# Patient Record
Sex: Female | Born: 1985 | Race: White | Hispanic: No | Marital: Married | Smoking: Never smoker
Health system: Southern US, Community
[De-identification: ages and names within clinical notes are randomized; demographics above are authoritative.]

## PROBLEM LIST (undated history)

## (undated) DIAGNOSIS — F419 Anxiety disorder, unspecified: Secondary | ICD-10-CM

## (undated) DIAGNOSIS — J302 Other seasonal allergic rhinitis: Secondary | ICD-10-CM

## (undated) DIAGNOSIS — D259 Leiomyoma of uterus, unspecified: Secondary | ICD-10-CM

## (undated) DIAGNOSIS — R55 Syncope and collapse: Secondary | ICD-10-CM

## (undated) DIAGNOSIS — M25559 Pain in unspecified hip: Secondary | ICD-10-CM

## (undated) DIAGNOSIS — B070 Plantar wart: Secondary | ICD-10-CM

## (undated) DIAGNOSIS — F988 Other specified behavioral and emotional disorders with onset usually occurring in childhood and adolescence: Secondary | ICD-10-CM

## (undated) DIAGNOSIS — R5383 Other fatigue: Secondary | ICD-10-CM

## (undated) DIAGNOSIS — J45909 Unspecified asthma, uncomplicated: Secondary | ICD-10-CM

## (undated) DIAGNOSIS — F329 Major depressive disorder, single episode, unspecified: Secondary | ICD-10-CM

## (undated) DIAGNOSIS — F32A Depression, unspecified: Secondary | ICD-10-CM

## (undated) DIAGNOSIS — D649 Anemia, unspecified: Secondary | ICD-10-CM

## (undated) HISTORY — DX: Unspecified asthma, uncomplicated: J45.909

## (undated) HISTORY — DX: Anemia, unspecified: D64.9

## (undated) HISTORY — DX: Major depressive disorder, single episode, unspecified: F32.9

## (undated) HISTORY — PX: RECONSTRUCTION MANDIBLE / MAXILLA: SUR1083

## (undated) HISTORY — DX: Other seasonal allergic rhinitis: J30.2

## (undated) HISTORY — DX: Pain in unspecified hip: M25.559

## (undated) HISTORY — PX: NASAL SEPTUM SURGERY: SHX37

## (undated) HISTORY — DX: Anxiety disorder, unspecified: F41.9

## (undated) HISTORY — DX: Depression, unspecified: F32.A

## (undated) HISTORY — DX: Other specified behavioral and emotional disorders with onset usually occurring in childhood and adolescence: F98.8

---
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
## 2009-11-10 ENCOUNTER — Emergency Department (HOSPITAL_COMMUNITY): Admission: EM | Admit: 2009-11-10 | Discharge: 2009-11-10 | Payer: Self-pay | Admitting: Emergency Medicine

## 2010-10-09 ENCOUNTER — Emergency Department (HOSPITAL_COMMUNITY): Admission: EM | Admit: 2010-10-09 | Discharge: 2010-10-09 | Payer: Self-pay | Admitting: Family Medicine

## 2010-11-03 ENCOUNTER — Emergency Department (HOSPITAL_COMMUNITY)
Admission: EM | Admit: 2010-11-03 | Discharge: 2010-11-03 | Payer: Self-pay | Source: Home / Self Care | Admitting: Emergency Medicine

## 2011-03-06 LAB — STREP A DNA PROBE: Group A Strep Probe: NEGATIVE

## 2011-07-08 ENCOUNTER — Inpatient Hospital Stay (INDEPENDENT_AMBULATORY_CARE_PROVIDER_SITE_OTHER)
Admission: RE | Admit: 2011-07-08 | Discharge: 2011-07-08 | Disposition: A | Discharge status: Home / Self Care | Payer: 59 | Source: Ambulatory Visit | Attending: Family Medicine | Admitting: Family Medicine

## 2011-07-08 DIAGNOSIS — H60399 Other infective otitis externa, unspecified ear: Secondary | ICD-10-CM

## 2011-09-08 ENCOUNTER — Emergency Department (HOSPITAL_COMMUNITY)
Admission: EM | Admit: 2011-09-08 | Discharge: 2011-09-08 | Disposition: A | Discharge status: Home / Self Care | Payer: No Typology Code available for payment source | Attending: Emergency Medicine | Admitting: Emergency Medicine

## 2011-09-08 DIAGNOSIS — F988 Other specified behavioral and emotional disorders with onset usually occurring in childhood and adolescence: Secondary | ICD-10-CM | POA: Insufficient documentation

## 2011-09-08 DIAGNOSIS — IMO0002 Reserved for concepts with insufficient information to code with codable children: Secondary | ICD-10-CM | POA: Insufficient documentation

## 2011-09-08 DIAGNOSIS — I10 Essential (primary) hypertension: Secondary | ICD-10-CM | POA: Insufficient documentation

## 2011-09-10 LAB — POCT PREGNANCY, URINE: Preg Test, Ur: NEGATIVE

## 2012-04-23 ENCOUNTER — Telehealth: Payer: Self-pay

## 2012-04-23 NOTE — Telephone Encounter (Signed)
PT IN NEED OF HER CONCERTA. PLEASE CALL [phone]

## 2012-04-24 MED ORDER — METHYLPHENIDATE HCL ER (OSM) 18 MG PO TBCR: 36.0000 mg | EXTENDED_RELEASE_TABLET | ORAL | Status: DC

## 2012-04-24 NOTE — Telephone Encounter (Signed)
Rx printed.  Needs OV for next fill. 

## 2012-04-24 NOTE — Telephone Encounter (Signed)
Chart states pt taking Concerta 18mg  2 PO/day. Verified with pt and this dosage is correct. Ok to rf?

## 2012-04-25 NOTE — Telephone Encounter (Signed)
LMOM THAT RX READY FOR P/U AND THAT SHE WILL NEED OV FOR MORE MED.

## 2012-07-04 ENCOUNTER — Telehealth: Payer: Self-pay

## 2012-07-04 NOTE — Telephone Encounter (Signed)
Please call patient-needs OV.  What's the plan.

## 2012-07-04 NOTE — Telephone Encounter (Signed)
I called her to advise and she will come in on Monday

## 2012-07-04 NOTE — Telephone Encounter (Signed)
Looks like OV is needed... Correct?

## 2012-07-04 NOTE — Telephone Encounter (Signed)
Pt is requesting refill on methylphenidate (CONCERTA) 18 MG CR tablet

## 2012-09-01 ENCOUNTER — Ambulatory Visit (INDEPENDENT_AMBULATORY_CARE_PROVIDER_SITE_OTHER): Payer: 59 | Admitting: Physician Assistant

## 2012-09-01 VITALS — BP 106/74 | HR 74 | Temp 98.0°F | Resp 16 | Ht 63.0 in | Wt 135.8 lb

## 2012-09-01 DIAGNOSIS — F329 Major depressive disorder, single episode, unspecified: Secondary | ICD-10-CM

## 2012-09-01 DIAGNOSIS — Z3009 Encounter for other general counseling and advice on contraception: Secondary | ICD-10-CM

## 2012-09-01 DIAGNOSIS — D509 Iron deficiency anemia, unspecified: Secondary | ICD-10-CM | POA: Insufficient documentation

## 2012-09-01 DIAGNOSIS — Z30011 Encounter for initial prescription of contraceptive pills: Secondary | ICD-10-CM

## 2012-09-01 DIAGNOSIS — R87619 Unspecified abnormal cytological findings in specimens from cervix uteri: Secondary | ICD-10-CM

## 2012-09-01 DIAGNOSIS — F419 Anxiety disorder, unspecified: Secondary | ICD-10-CM | POA: Insufficient documentation

## 2012-09-01 DIAGNOSIS — F32A Depression, unspecified: Secondary | ICD-10-CM

## 2012-09-01 DIAGNOSIS — D649 Anemia, unspecified: Secondary | ICD-10-CM

## 2012-09-01 DIAGNOSIS — Z23 Encounter for immunization: Secondary | ICD-10-CM

## 2012-09-01 DIAGNOSIS — Z124 Encounter for screening for malignant neoplasm of cervix: Secondary | ICD-10-CM

## 2012-09-01 DIAGNOSIS — IMO0002 Reserved for concepts with insufficient information to code with codable children: Secondary | ICD-10-CM

## 2012-09-01 DIAGNOSIS — J45990 Exercise induced bronchospasm: Secondary | ICD-10-CM

## 2012-09-01 DIAGNOSIS — Z113 Encounter for screening for infections with a predominantly sexual mode of transmission: Secondary | ICD-10-CM

## 2012-09-01 DIAGNOSIS — F988 Other specified behavioral and emotional disorders with onset usually occurring in childhood and adolescence: Secondary | ICD-10-CM

## 2012-09-01 LAB — POCT CBC
Granulocyte percent: 50 %G (ref 37–80)
HCT, POC: 35.4 % — AB (ref 37.7–47.9)
Hemoglobin: 10.6 g/dL — AB (ref 12.2–16.2)
Lymph, poc: 2.1 (ref 0.6–3.4)
MPV: 11.2 fL (ref 0–99.8)
POC Granulocyte: 2.6 (ref 2–6.9)
POC MID %: 8.8 %M (ref 0–12)
RBC: 4.25 M/uL (ref 4.04–5.48)

## 2012-09-01 LAB — POCT UA - MICROSCOPIC ONLY
Mucus, UA: NEGATIVE
RBC, urine, microscopic: NEGATIVE

## 2012-09-01 LAB — POCT URINALYSIS DIPSTICK
Bilirubin, UA: NEGATIVE
Blood, UA: NEGATIVE
Glucose, UA: NEGATIVE
Ketones, UA: NEGATIVE
Nitrite, UA: NEGATIVE
pH, UA: 6

## 2012-09-01 LAB — HIV ANTIBODY (ROUTINE TESTING W REFLEX): HIV: NONREACTIVE

## 2012-09-01 MED ORDER — NORGESTIMATE-ETH ESTRADIOL 0.25-35 MG-MCG PO TABS: 1.0000 | ORAL_TABLET | Freq: Every day | ORAL | Status: DC

## 2012-09-01 MED ORDER — METHYLPHENIDATE HCL ER (OSM) 36 MG PO TBCR: 36.0000 mg | EXTENDED_RELEASE_TABLET | ORAL | Status: DC

## 2012-09-01 MED ORDER — ALBUTEROL SULFATE HFA 108 (90 BASE) MCG/ACT IN AERS: 2.0000 | INHALATION_SPRAY | RESPIRATORY_TRACT | Status: DC | PRN

## 2012-09-01 MED ORDER — VENLAFAXINE HCL ER 75 MG PO CP24: 75.0000 mg | ORAL_CAPSULE | Freq: Every day | ORAL | Status: DC
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
## 2012-09-02 ENCOUNTER — Telehealth: Payer: Self-pay | Admitting: Physician Assistant

## 2012-09-02 NOTE — Telephone Encounter (Signed)
Patient states that Christy Alvarado didn't receive the Rx for Effexor XR I sent yesterday.  1. Please call patient to clarify her dose (dose she take 75 mg 1 QD or 2 QD?  If 2 QD, would she rather take one 150 mg?)  2. Call the Alvarado and verify/clarify the Rx for #90 days, RF x 3.

## 2012-09-03 ENCOUNTER — Other Ambulatory Visit: Payer: Self-pay | Admitting: Radiology

## 2012-09-03 DIAGNOSIS — F329 Major depressive disorder, single episode, unspecified: Secondary | ICD-10-CM

## 2012-09-03 MED ORDER — VENLAFAXINE HCL ER 75 MG PO CP24: ORAL_CAPSULE | ORAL | Status: DC

## 2012-09-03 NOTE — Telephone Encounter (Signed)
Called pt, Effexor dose is is 75 mg bid. She wants to stay at this dose.so I called in Effexor XR 75mg  2qd #180 with 3 RF.

## 2012-09-05 LAB — PAP IG, CT-NG, RFX HPV ASCU

## 2012-09-06 NOTE — Addendum Note (Signed)
Addended by: Johnnette Litter on: 09/06/2012 11:11 AM   Modules accepted: Orders

## 2012-09-24 ENCOUNTER — Ambulatory Visit (INDEPENDENT_AMBULATORY_CARE_PROVIDER_SITE_OTHER): Payer: 59 | Admitting: Family Medicine

## 2012-09-24 VITALS — BP 122/88 | HR 84 | Temp 98.0°F | Resp 16 | Ht 63.0 in | Wt 131.0 lb

## 2012-09-24 DIAGNOSIS — R5381 Other malaise: Secondary | ICD-10-CM

## 2012-09-24 LAB — POCT CBC
Granulocyte percent: 63.3 %G (ref 37–80)
MCH, POC: 25.7 pg — AB (ref 27–31.2)
MCV: 83.6 fL (ref 80–97)
MID (cbc): 0.5 (ref 0–0.9)
MPV: 10.9 fL (ref 0–99.8)
POC LYMPH PERCENT: 30.3 %L (ref 10–50)
POC MID %: 6.4 %M (ref 0–12)
Platelet Count, POC: 252 10*3/uL (ref 142–424)
RBC: 4.9 M/uL (ref 4.04–5.48)
RDW, POC: 14.8 %
WBC: 7.7 10*3/uL (ref 4.6–10.2)

## 2012-09-24 NOTE — Progress Notes (Signed)
[facility] [address], [place] [address] [phone]  Date:  09/24/2012   Name:  Christy Alvarado   DOB:  06/29/1986   MRN:  [id number]  PCP:  Tally Due, MD    Chief Complaint: Dizziness, Headache and Generalized Body Aches   History of Present Illness:  Christy Alvarado is a 26 y.o. very pleasant female patient who presents with the following:  She was at work a couple of hours ago and noted the onset of HA and dizziness.  She still feels lightheaded.  Her HA is better- just a slight now.  She does have body aches as well- mostly in her back and shoulders.   She has not taken any anti- pyretics.  No cough, no ST, no fever noted as of yet.   She feels tired.  She does have a runny nose.   No GI symptoms such as N/V/D.  She did have some abdominal discomfort earlier today that got better after she had a BM.  She did eat breakfast this morning.   She felt fine yesterday LMP 09/15/12.   She works in [employer] so sick contacts are always possible  Patient Active Problem List  Diagnosis  . Depression  . ADD (attention deficit disorder)  . Anemia  . Exercise-induced asthma    Past Medical History  Diagnosis Date  . Depression   . ADD (attention deficit disorder)   . Seasonal allergies   . Anemia   . Asthma   . Anxiety     Past Surgical History  Procedure Date  . Reconstruction mandible / maxilla     underbite correction    History  Substance Use Topics  . Smoking status: Never Smoker   . Smokeless tobacco: Never Used  . Alcohol Use: 0.0 - 6.0 oz/week    0-10 Cans of beer per week    Family History  Problem Relation Age of Onset  . Cancer Father     prostate    No Known Allergies  Medication list has been reviewed and updated.  Current Outpatient Prescriptions on File Prior to Visit  Medication Sig Dispense Refill  . albuterol (PROVENTIL HFA;VENTOLIN HFA) 108 (90 BASE) MCG/ACT inhaler Inhale 2 puffs into the lungs  every 4 (four) hours as needed for wheezing (cough, shortness of breath or wheezing.).  1 Inhaler  1  . ferrous sulfate 325 (65 FE) MG EC tablet Take 325 mg by mouth daily. Doesn't take them every day      . methylphenidate (CONCERTA) 36 MG CR tablet Take 1 tablet (36 mg total) by mouth every morning.  30 tablet  0  . norgestimate-ethinyl estradiol (ORTHO-CYCLEN,SPRINTEC,PREVIFEM) 0.25-35 MG-MCG tablet Take 1 tablet by mouth daily.  30 Package  4  . venlafaxine XR (EFFEXOR-XR) 75 MG 24 hr capsule 2 capsules daily  180 capsule  3  . methylphenidate (CONCERTA) 36 MG CR tablet Take 1 tablet (36 mg total) by mouth every morning. May fill on/after 09/30/2012.  30 tablet  0  . methylphenidate (CONCERTA) 36 MG CR tablet Take 1 tablet (36 mg total) by mouth every morning. May fill on/after 10/30/2012.  30 tablet  0    Review of Systems:  As per HPI- otherwise negative.   Physical Examination: Filed Vitals:   09/24/12 1402  BP: 122/88  Pulse: 84  Temp: 98 F (36.7 C)  Resp: 16   Filed Vitals:   09/24/12 1402  Height: 5\' 3"  (1.6  m)  Weight: 131 lb (59.421 kg)   Body mass index is 23.21 kg/(m^2). Ideal Body Weight: Weight in (lb) to have BMI = 25: 140.8   GEN: WDWN, NAD, Non-toxic, A & O x 3, looks well HEENT: Atraumatic, Normocephalic. Neck supple. No masses, No LAD.  Bilateral TM wnl, oropharynx normal.  PEERL,EOMI.   No meningismus Ears and Nose: No external deformity. CV: RRR, No M/G/R. No JVD. No thrill. No extra heart sounds. PULM: CTA B, no wheezes, crackles, rhonchi. No retractions. No resp. distress. No accessory muscle use. ABD: S, NT, ND, +BS. No rebound. No HSM. EXTR: No c/c/e NEURO Normal gait. Normal strength, sensation and DTR all extremities.  PSYCH: Normally interactive. Conversant. Not depressed or anxious appearing.  Calm demeanor.   Results for orders placed in visit on 09/24/12  POCT CBC      Component Value Range   WBC 7.7  4.6 - 10.2 K/uL   Lymph, poc 2.3   0.6 - 3.4   POC LYMPH PERCENT 30.3  10 - 50 %L   MID (cbc) 0.5  0 - 0.9   POC MID % 6.4  0 - 12 %M   POC Granulocyte 4.9  2 - 6.9   Granulocyte percent 63.3  37 - 80 %G   RBC 4.90  4.04 - 5.48 M/uL   Hemoglobin 12.6  12.2 - 16.2 g/dL   HCT, POC 16.1  09.6 - 47.9 %   MCV 83.6  80 - 97 fL   MCH, POC 25.7 (*) 27 - 31.2 pg   MCHC 30.7 (*) 31.8 - 35.4 g/dL   RDW, POC 04.5     Platelet Count, POC 252  142 - 424 K/uL   MPV 10.9  0 - 99.8 fL     Assessment and Plan: 1. Malaise  POCT CBC   Christy Alvarado is ill with non- specific symptoms today.  She may have a viral illness or even the flu.  Stressed to her the importance of close follow- up if she is not better or getting worse.  Gave her a note for work.  She will rest and push fluids.    Christy Amsterdam, MD

## 2012-09-24 NOTE — Patient Instructions (Addendum)
You probably have a viral infection.  Drink plenty of fluids and rest.  If you are not better within the next 36 hours please give [place] a call or come back in.

## 2012-11-21 ENCOUNTER — Ambulatory Visit: Payer: 59

## 2012-11-21 ENCOUNTER — Ambulatory Visit (INDEPENDENT_AMBULATORY_CARE_PROVIDER_SITE_OTHER): Payer: 59 | Admitting: Physician Assistant

## 2012-11-21 VITALS — BP 111/67 | HR 62 | Temp 97.9°F | Resp 16 | Ht 63.0 in | Wt 135.0 lb

## 2012-11-21 DIAGNOSIS — L600 Ingrowing nail: Secondary | ICD-10-CM

## 2012-11-21 DIAGNOSIS — M79609 Pain in unspecified limb: Secondary | ICD-10-CM

## 2012-11-21 DIAGNOSIS — M79676 Pain in unspecified toe(s): Secondary | ICD-10-CM

## 2012-11-21 MED ORDER — CEPHALEXIN 500 MG PO CAPS: 500.0000 mg | ORAL_CAPSULE | Freq: Three times a day (TID) | ORAL | Status: DC

## 2012-11-21 MED ORDER — HYDROCODONE-ACETAMINOPHEN 5-325 MG PO TABS: 1.0000 | ORAL_TABLET | Freq: Four times a day (QID) | ORAL | Status: DC | PRN

## 2012-11-21 NOTE — Progress Notes (Addendum)
Patient ID: Christy Alvarado MRN: [id number], DOB: 1986/01/27, 26 y.o. Date of Encounter: 11/21/2012, 5:10 PM  Primary Physician: Tally Due, MD  Chief Complaint: Ingrown right great toenail  HPI: 26 y.o. year old female with history below presents with a one week history of right great toe pain. She states one week ago she stubbed it, but is not certain on what. Since she stubbed it there has been continual pain along the lateral aspect of the nail. She now has some erythema and tissue growing over the lateral aspect of the nail. It is sore when she walks. She does have normal movement and sensation.    Past Medical History  Diagnosis Date  . Depression   . ADD (attention deficit disorder)   . Seasonal allergies   . Anemia   . Asthma   . Anxiety      Home Meds: Prior to Admission medications   Medication Sig Start Date End Date Taking? Authorizing Provider  albuterol (PROVENTIL HFA;VENTOLIN HFA) 108 (90 BASE) MCG/ACT inhaler Inhale 2 puffs into the lungs every 4 (four) hours as needed for wheezing (cough, shortness of breath or wheezing.). 09/01/12  Yes [name] S Jeffery, PA-C  ferrous sulfate 325 (65 FE) MG EC tablet Take 325 mg by mouth daily. Doesn't take them every day   Yes Historical Provider, MD  methylphenidate (CONCERTA) 36 MG CR tablet Take 1 tablet (36 mg total) by mouth every morning. 09/01/12  Yes [name] Tessa Lerner, PA-C  venlafaxine XR (EFFEXOR-XR) 75 MG 24 hr capsule 2 capsules daily 09/03/12  Yes [name] S Jeffery, PA-C  methylphenidate (CONCERTA) 36 MG CR tablet Take 1 tablet (36 mg total) by mouth every morning. May fill on/after 09/30/2012. 09/01/12   Fernande Bras, PA-C  methylphenidate (CONCERTA) 36 MG CR tablet Take 1 tablet (36 mg total) by mouth every morning. May fill on/after 10/30/2012. 09/01/12   Fernande Bras, PA-C    Allergies: No Known Allergies  History   Social History  . Marital Status: Single    Spouse Name: n/a    Number of Children:  0  . Years of Education: 15+   Occupational History  . SERVER     [employer]   Social History Main Topics  . Smoking status: Never Smoker   . Smokeless tobacco: Never Used  . Alcohol Use: 0.0 - 6.0 oz/week    0-10 Cans of beer per week  . Drug Use: No  . Sexually Active: Yes -- Female partner(s)    Birth Control/ Protection: Condom   Other Topics Concern  . Not on file   Social History Narrative   Lives with 2 sisters (and soon-to-be brother-in-law) in the house next-door to their parents.     Review of Systems: Constitutional: negative for chills, fever, night sweats, weight changes, or fatigue  Cardiovascular: negative for chest pain or palpitations Respiratory: negative for hemoptysis, wheezing, shortness of breath, or cough Dermatological: see above   Physical Exam: Blood pressure 111/67, pulse 62, temperature 97.9 F (36.6 C), resp. rate 16, height 5\' 3"  (1.6 m), weight 135 lb (61.236 kg), last menstrual period 11/15/2012., Body mass index is 23.91 kg/(m^2). General: Well developed, well nourished, in no acute distress. Head: Normocephalic, atraumatic, eyes without discharge, sclera non-icteric, nares are without discharge.   Neck: Supple. No thyromegaly. Full ROM. No lymphadenopathy. Lungs: Breathing is unlabored. Heart: Regular rate. Msk:  Strength and tone normal for age. Extremities/Skin: Right great toe: Erythematous along lateral nail  edge with hyperplastic tissue consistent with ingrown toenail. Flexion/extension intact. Normal sensation. Cap refill less than 2 seconds. Warm and dry. No clubbing or cyanosis. No edema. No rashes or suspicious lesions. Neuro: Alert and oriented X 3. Moves all extremities spontaneously. Gait is normal. CNII-XII grossly in tact. Psych:  Responds to questions appropriately with a normal affect.   [facility] reading (PRIMARY) by  Dr. Perrin Maltese. Negative   ASSESSMENT AND PLAN:  26 y.o. year old female with right ingrown tow nail  secondary to trauma. -Wedge resection per above -Keflex 500 mg 1 po tid #30 no RF -Norco 5/325 mg 1 po q 4-6 hours prn pain #30 no RF, SED -Wound care -RTC precautions   Signed, Eula Listen, PA-C 11/21/2012 5:10 PM   Addendum:   PROCEDURE NOTE: Verbal consent obtained.  Risks and benefits explained.  Patient made an informed decision under a sound mind and judgement.  Sterile technique employed. Numbing: Anesthesia obtained with 1:1 ratio 2% plain lidocaine with 0.5% Marcaine, 4 cc total. Betadine prep per usual protocol.  Lateral aspect of nail lifted without difficulty and removed in total. Proximal aspect of nail bed explored revealing no nail remnants. Ingrown tissue debrided and irrigated. No active bleeding.  Xeroform dressing applied. Wound care instructions including precautions covered with patient. Handout given.   SignedEula Listen, PA-C 11/21/2012 7:43 PM

## 2012-11-21 NOTE — Patient Instructions (Addendum)
Ingrown Toenail  Please see your wound care insructions An ingrown toenail occurs when the sharp edge of your toenail grows into the skin. Causes of ingrown toenails include toenails clipped too far back or poorly fitting shoes. Activities involving sudden stops (basketball, tennis) causing "toe jamming" may lead to an ingrown nail. HOME CARE INSTRUCTIONS   Soak the whole foot in warm soapy water for 20 minutes, 3 times per day.  You may lift the edge of the nail away from the sore skin by wedging a small piece of cotton under the corner of the nail. Be careful not to dig (traumatize) and cause more injury to the area.  Wear shoes that fit well. While the ingrown nail is causing problems, sandals may be beneficial.  Trim your toenails regularly and carefully. Cut your toenails straight across, not in a curve. This will prevent injury to the skin at the corners of the toenail.  Keep your feet clean and dry.  Crutches may be helpful early in treatment if walking is painful.  Antibiotics, if prescribed, should be taken as directed.  Return for a wound check in 2 days or as directed.  Only take over-the-counter or prescription medicines for pain, discomfort, or fever as directed by your caregiver. SEEK IMMEDIATE MEDICAL CARE IF:   You have a fever.  You have increasing pain, redness, swelling, or heat at the wound site.  Your toe is not better in 7 days. If conservative treatment is not successful, surgical removal of a portion or all of the nail may be necessary. MAKE SURE YOU:   Understand these instructions.  Will watch your condition.  Will get help right away if you are not doing well or get worse. Document Released: 11/16/2000 Document Revised: 02/11/2012 Document Reviewed: 11/10/2008 [facility] Patient Information 2013 [place], [place].

## 2013-05-28 ENCOUNTER — Ambulatory Visit (INDEPENDENT_AMBULATORY_CARE_PROVIDER_SITE_OTHER): Payer: 59 | Admitting: Physician Assistant

## 2013-05-28 ENCOUNTER — Encounter: Payer: Self-pay | Admitting: Physician Assistant

## 2013-05-28 VITALS — BP 123/73 | HR 69 | Temp 98.3°F | Resp 16 | Ht 63.0 in | Wt 139.6 lb

## 2013-05-28 DIAGNOSIS — F329 Major depressive disorder, single episode, unspecified: Secondary | ICD-10-CM

## 2013-05-28 DIAGNOSIS — F3289 Other specified depressive episodes: Secondary | ICD-10-CM

## 2013-05-28 MED ORDER — VENLAFAXINE HCL ER 37.5 MG PO CP24: 37.5000 mg | ORAL_CAPSULE | Freq: Every day | ORAL | Status: DC

## 2013-05-28 MED ORDER — CLONAZEPAM 0.5 MG PO TABS: 0.2500 mg | ORAL_TABLET | Freq: Two times a day (BID) | ORAL | Status: DC | PRN

## 2013-05-28 NOTE — Patient Instructions (Signed)
If you do not tolerate the Effexor XR at the lower dose, even with the clonazepam, let me know and we'll switch you back to Paxil (or one of it's cousins).

## 2013-05-28 NOTE — Progress Notes (Signed)
  Subjective:    Patient ID: Christy Alvarado, female    DOB: 1986/03/06, 27 y.o.   MRN: [id number]  HPI This 27 y.o. female presents for evaluation of depression.  Most recently she was on Effexor XR 75 mg, but was having adverse effects.  She reports irritability and anxiety, sleep disturbance and nausea.  She also notes that she had been on this medication for a while, without adverse effects, but had been stopping and starting it when the symptoms began.  She read online that the symptoms could last for 3 weeks upon initiating the medication and she didn't think she could tolerate that.    She's generally done well off the effexor, but has recently had more depression and increased irritability.  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  Previously took Paxil and Pristiq, both with good effect, and both tolerated well, to her recollection.  Past medical history, surgical history, family history, social history and problem list reviewed.   Review of Systems As above.  No SI/HI.    Objective:   Physical Exam  Blood pressure 123/73, pulse 69, temperature 98.3 F (36.8 C), temperature source Oral, resp. rate 16, height 5\' 3"  (1.6 m), weight 139 lb 9.6 oz (63.322 kg), last menstrual period 05/21/2013, SpO2 99.00%. Body mass index is 24.74 kg/(m^2). Well-developed, well nourished WF who is awake, alert and oriented, in NAD. HEENT: [place]/AT, sclera and conjunctiva are clear.   Neck: supple, non-tender, no lymphadenopathy, thyromegaly. Heart: RRR, no murmur Lungs: normal effort, CTA Extremities: no cyanosis, clubbing or edema. Skin: warm and dry without rash. Psychologic: good mood and appropriate affect, normal speech and behavior.       Assessment & Plan:  Depression - Plan: venlafaxine XR (EFFEXOR-XR) 37.5 MG 24 hr capsule, clonazePAM (KLONOPIN) 0.5 MG tablet  Restart the Effexor at the lower  dose, to help reduce potential adverse effects, and PRN clonazepam. If they are intolerable at this dose, re-try paroxetine, as she's done well on it previously.    Fernande Bras, PA-C Physician Assistant-Certified [facility]

## 2013-06-09 ENCOUNTER — Ambulatory Visit (INDEPENDENT_AMBULATORY_CARE_PROVIDER_SITE_OTHER): Payer: 59 | Admitting: Physician Assistant

## 2013-06-09 VITALS — BP 122/72 | HR 86 | Temp 98.0°F | Resp 17 | Ht 63.5 in | Wt 137.0 lb

## 2013-06-09 DIAGNOSIS — J020 Streptococcal pharyngitis: Secondary | ICD-10-CM

## 2013-06-09 DIAGNOSIS — J029 Acute pharyngitis, unspecified: Secondary | ICD-10-CM

## 2013-06-09 MED ORDER — AMOXICILLIN 875 MG PO TABS: 875.0000 mg | ORAL_TABLET | Freq: Two times a day (BID) | ORAL | Status: DC

## 2013-06-09 MED ORDER — FIRST-DUKES MOUTHWASH MT SUSP: 5.0000 mL | Freq: Four times a day (QID) | OROMUCOSAL | Status: DC | PRN

## 2013-06-09 NOTE — Progress Notes (Signed)
Patient ID: GERRICA CYGAN MRN: [id number], DOB: 1986/02/05, 26 y.o. Date of Encounter: 06/09/2013, 6:59 PM  Primary Physician: Tally Due, MD  Chief Complaint:  Chief Complaint  Patient presents with  . Sore Throat    patient states her tonsils are swelling     HPI: 27 y.o. female presents with a 3-4 day history of sore throat. Subjective fever and chills. No cough, congestion, rhinorrhea, sinus pressure, otalgia, or headache. Normal hearing. No GI complaints. Able to swallow saliva, but hurts to do so. Decreased appetite secondary to sore throat.   Feels like the lower dose of Effexor is working well.   Past Medical History  Diagnosis Date  . Depression   . ADD (attention deficit disorder)   . Seasonal allergies   . Anemia   . Asthma   . Anxiety      Home Meds: Prior to Admission medications   Medication Sig Start Date End Date Taking? Authorizing Provider  albuterol (PROVENTIL HFA;VENTOLIN HFA) 108 (90 BASE) MCG/ACT inhaler Inhale 2 puffs into the lungs every 6 (six) hours as needed for wheezing or shortness of breath.   Yes Historical Provider, MD  clonazePAM (KLONOPIN) 0.5 MG tablet Take 0.5-1 tablets (0.25-0.5 mg total) by mouth 2 (two) times daily as needed for anxiety. 05/28/13  Yes [name] S Jeffery, PA-C  venlafaxine XR (EFFEXOR-XR) 37.5 MG 24 hr capsule Take 1 capsule (37.5 mg total) by mouth daily. 05/28/13  Yes [name] Tessa Lerner, PA-C                  Allergies: No Known Allergies  History   Social History  . Marital Status: Single    Spouse Name: n/a    Number of Children: 0  . Years of Education: 15+   Occupational History  . SERVER     [employer]   Social History Main Topics  . Smoking status: Never Smoker   . Smokeless tobacco: Never Used  . Alcohol Use: 0 - 6 oz/week    0-10 Cans of beer per week  . Drug Use: No  . Sexually Active: Yes -- Female partner(s)    Birth Control/ Protection: Condom   Other Topics Concern  .  Not on file   Social History Narrative   Lives with 2 sisters (and soon-to-be brother-in-law) in the house next-door to their parents.     Review of Systems: Constitutional: negative for chills, fever, night sweats or weight changes HEENT: see above Cardiovascular: negative for chest pain or palpitations Respiratory: negative for hemoptysis, wheezing, or shortness of breath Abdominal: negative for abdominal pain, nausea, vomiting or diarrhea Dermatological: negative for rash Neurologic: positive for headache   Physical Exam: Blood pressure 122/72, pulse 86, temperature 98 F (36.7 C), temperature source Oral, resp. rate 17, height 5' 3.5" (1.613 m), weight 137 lb (62.143 kg), last menstrual period 05/21/2013, SpO2 99.00%., Body mass index is 23.88 kg/(m^2). General: Well developed, well nourished, in no acute distress. Head: Normocephalic, atraumatic, eyes without discharge, sclera non-icteric, nares are patent. Bilateral auditory canals clear, TM's are without perforation, pearly grey with reflective cone of light bilaterally. No sinus TTP. Oral cavity moist, dentition normal. Posterior pharynx with post nasal drip, mild erythema, and mild exudate along the right tonsillar pillar. No peritonsillar abscess. Uvula midline.  Neck: Supple. No thyromegaly. Full ROM. Lymph nodes: less than 2 cm AC right side. Lungs: Clear bilaterally to auscultation without wheezes, rales, or rhonchi. Breathing is unlabored. Heart: RRR  with S1 S2. No murmurs, rubs, or gallops appreciated. Msk:  Strength and tone normal for age. Extremities: No clubbing or cyanosis. No edema. Neuro: Alert and oriented X 3. Moves all extremities spontaneously. CNII-XII grossly in tact. Psych:  Responds to questions appropriately with a normal affect.   Labs: Results for orders placed in visit on 06/09/13  POCT RAPID STREP A (OFFICE)      Result Value Range   Rapid Strep A Screen Negative  Negative   TC  pending  ASSESSMENT AND PLAN:  27 y.o. female with likely strep pharyngitis -Amoxicillin 875 mg 1 po bid #20 no RF -DMM 1 tsp po qid prn ST #120 mL RF 1 -Await culture results -New tooth brush -Tylenol/Motrin prn -Rest/fluids -RTC precautions -RTC 3-5 days if no improvement  Signed, Eula Listen, PA-C 06/09/2013 6:59 PM

## 2013-06-12 LAB — CULTURE, GROUP A STREP

## 2013-06-20 ENCOUNTER — Telehealth: Payer: Self-pay

## 2013-06-20 NOTE — Telephone Encounter (Signed)
Patient was bit by a dog and is currently at [facility] and wants a prescription called in to a pharmacy down there. [facility] [phone]

## 2013-06-21 NOTE — Telephone Encounter (Signed)
lmom that pt must be evaluated where she is and that we cannot call anything in because we have not evaluated her.

## 2014-04-14 ENCOUNTER — Ambulatory Visit (INDEPENDENT_AMBULATORY_CARE_PROVIDER_SITE_OTHER): Payer: 59 | Admitting: Physician Assistant

## 2014-04-14 VITALS — BP 100/60 | HR 61 | Temp 97.8°F | Resp 16 | Ht 62.0 in | Wt 133.0 lb

## 2014-04-14 DIAGNOSIS — M79609 Pain in unspecified limb: Secondary | ICD-10-CM

## 2014-04-14 DIAGNOSIS — B070 Plantar wart: Secondary | ICD-10-CM

## 2014-04-14 HISTORY — DX: Plantar wart: B07.0

## 2014-04-14 NOTE — Patient Instructions (Signed)
If you are seeing improvement, but not complete resolution - come back in 2 weeks, and we'll do another cryo treatment  If you are not improving (or certainly if you are worsening) please let me know sooner than 2 weeks   Warts Warts are a common viral infection. They are most commonly caused by the human papillomavirus (HPV). Warts can occur at all ages. However, they occur most frequently in older children and infrequently in the elderly. Warts may be single or multiple. Location and size varies. Warts can be spread by scratching the wart and then scratching normal skin. The life cycle of warts varies. However, most will disappear over many months to a couple years. Warts commonly do not cause problems (asymptomatic) unless they are over an area of pressure, such as the bottom of the foot. If they are large enough, they may cause pain with walking. DIAGNOSIS  Warts are most commonly diagnosed by their appearance. Tissue samples (biopsies) are not required unless the wart looks abnormal. Most warts have a rough surface, are round, oval, or irregular, and are skin-colored to light yellow, brown, or gray. They are generally less than  inch (1.3 cm), but they can be any size. TREATMENT   Observation or no treatment.  Freezing with liquid nitrogen.  High heat (cautery).  Boosting the body's immunity to fight off the wart (immunotherapy using Candida antigen).  Laser surgery.  Application of various irritants and solutions. HOME CARE INSTRUCTIONS  Follow your caregiver's instructions. No special precautions are necessary. Often, treatment may be followed by a return (recurrence) of warts. Warts are generally difficult to treat and get rid of. If treatment is done in a clinic setting, usually more than 1 treatment is required. This is usually done on only a monthly basis until the wart is completely gone. SEEK IMMEDIATE MEDICAL CARE IF: The treated skin becomes red, puffy (swollen), or  painful. Document Released: 08/29/2005 Document Revised: 03/16/2013 Document Reviewed: 02/24/2010 [facility] Patient Information 2014 [place].

## 2014-04-14 NOTE — Progress Notes (Signed)
   Subjective:    Patient ID: Christy Alvarado, female    DOB: Mar 14, 1986, 28 y.o.   MRN: [id number]  HPI   Christy Alvarado is a very pleasant 28 yr old female here with concern for a bump on the ball of her LEFT foot.  Has been there a couple months.  Now starting to become irritated.  Thinks possible a wart, but doesn't look like a wart.  No prior warts.  Does not think there could be a foreign body - does not recall stepping on anything.  She is a runner and frequently has calluses but never in this location.  She has no other lesions.  She otherwise feels well.    Review of Systems  Constitutional: Negative for fever and chills.  Respiratory: Negative.   Musculoskeletal: Negative.   Skin: Positive for wound (foot).       Objective:   Physical Exam  Vitals reviewed. Constitutional: She is oriented to person, place, and time. She appears well-developed and well-nourished. No distress.  HENT:  Head: Normocephalic and atraumatic.  Eyes: Conjunctivae are normal. No scleral icterus.  Cardiovascular: Intact distal pulses.   Pulmonary/Chest: Effort normal.  Neurological: She is alert and oriented to person, place, and time.  Skin: Skin is warm and dry.  Approx 1 cm round calloused area at ball of LEFT foot; does not appear classically like a wart, but given location and symptoms suspect in may in fact be a plantar wart  Psychiatric: She has a normal mood and affect. Her behavior is normal.    Procedure Note: Verbal consent obtained from the patient.  Topical lidocaine jelly applied to the area.  3 freeze/thaw cycles with liquid nitrogen applied.  Pt tolerated very well     Assessment & Plan:  Plantar wart   Christy Alvarado is a very pleasant 28 yr old female here with a calloused lesion on the ball of her LEFT foot, present for several months.  Likely plantar wart, so have treated as such with liquid nitrogen.  If not completely resolved in 2 wks, will repeat cryo - fast track card given for  this.  If worsening or no improvement, would refer to podiatry   E. Natividad Brood MHS, PA-C [facility] 5/14/20152:50 PM

## 2014-05-11 IMAGING — CR DG FOOT COMPLETE 3+V*R*
2 series · 2 of 2 positions shown · non-contrast
Comparison: None.

CLINICAL DATA: Stubbed toe.  Pain.

RIGHT FOOT COMPLETE - 3+ VIEW

[AP]
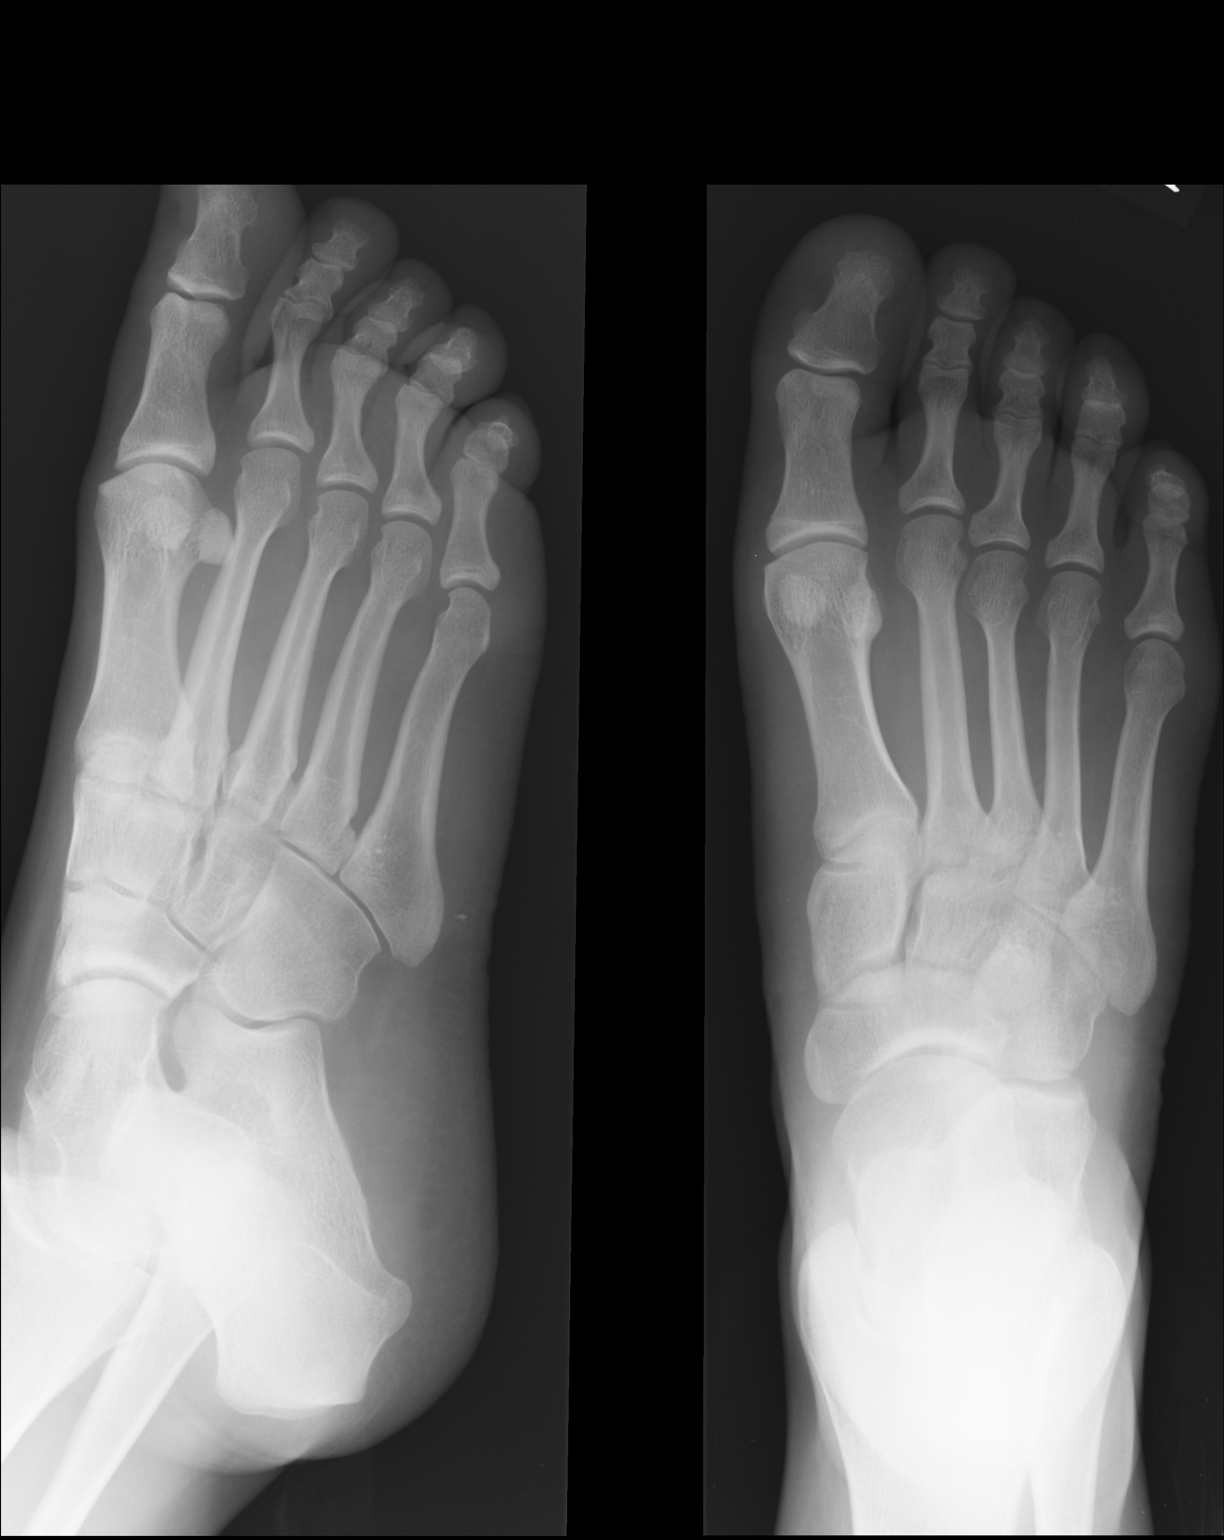

[lateral]
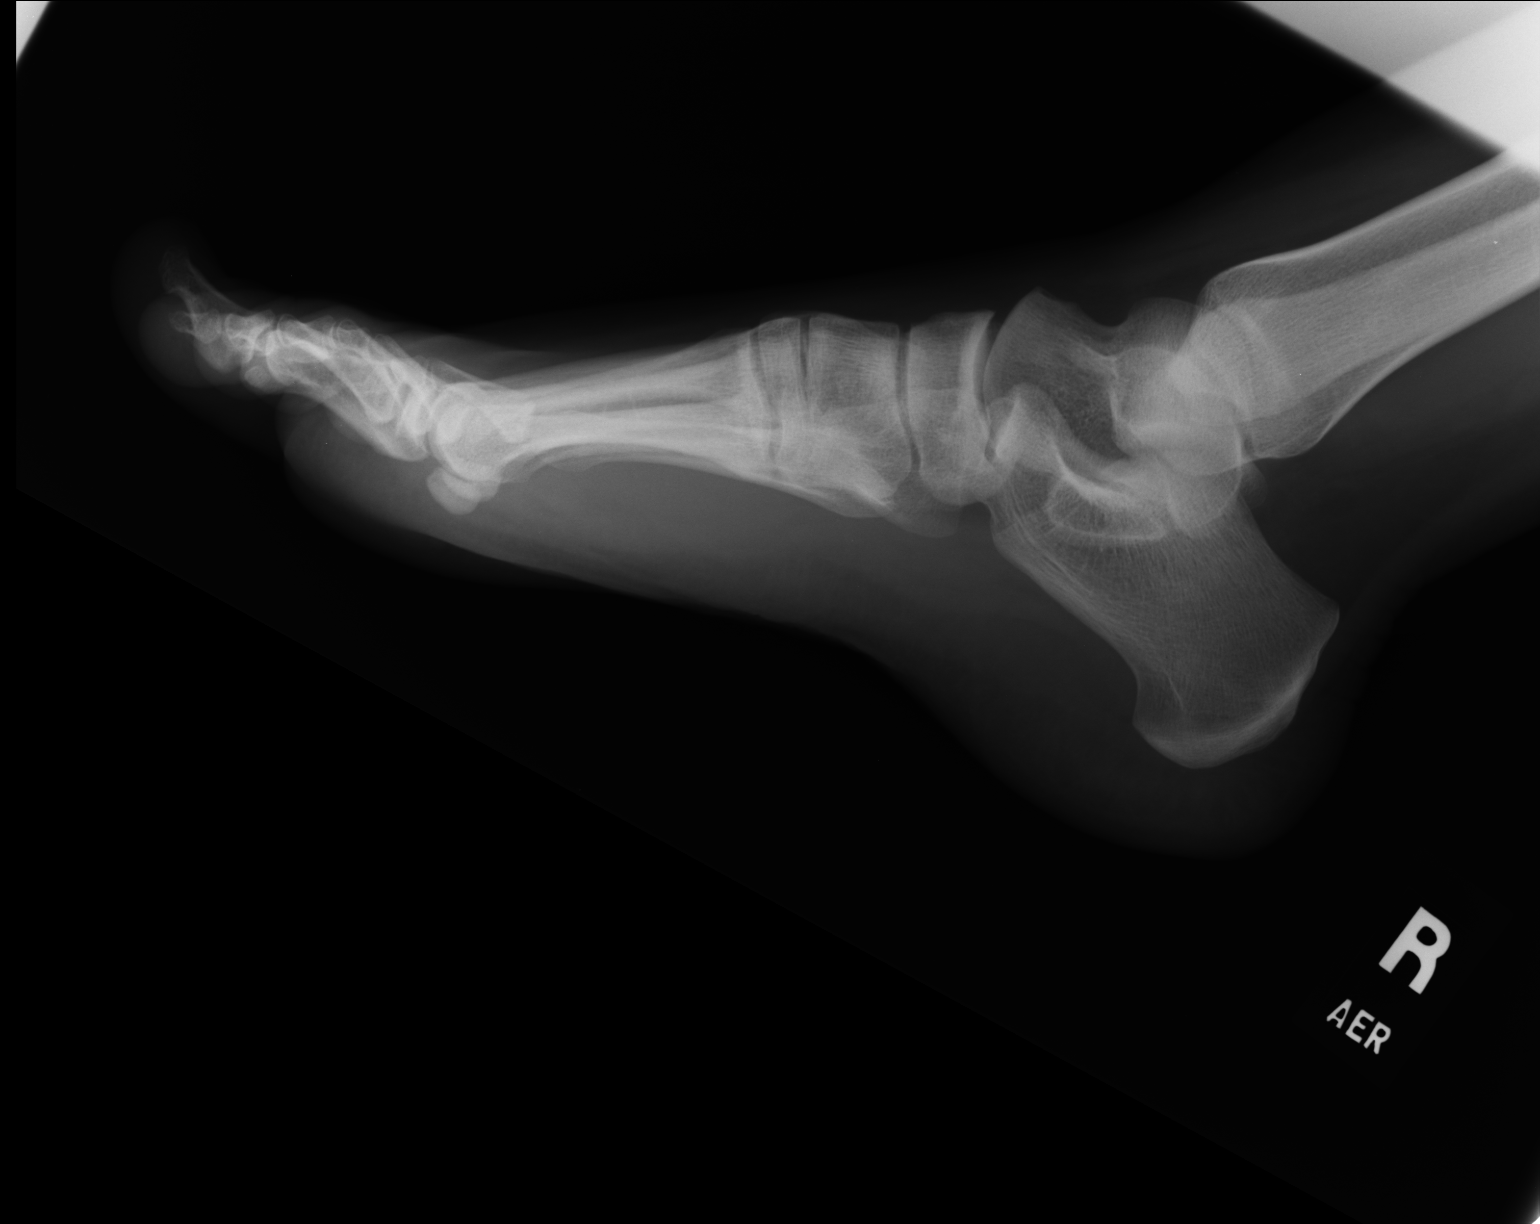

[2 of 2 positions shown; findings below may reference images not displayed]

FINDINGS: Soft tissue swelling is present about the distal aspect
of the great toe.  There is no underlying fracture or dislocation.
The foot is otherwise unremarkable.
IMPRESSION: Soft tissue swelling about the great toe without an underlying
fracture.

## 2015-09-08 ENCOUNTER — Ambulatory Visit (INDEPENDENT_AMBULATORY_CARE_PROVIDER_SITE_OTHER): Payer: 59 | Admitting: Family Medicine

## 2015-09-08 VITALS — BP 108/72 | HR 94 | Temp 99.1°F | Resp 18 | Ht 62.0 in

## 2015-09-08 DIAGNOSIS — J029 Acute pharyngitis, unspecified: Secondary | ICD-10-CM | POA: Diagnosis not present

## 2015-09-08 DIAGNOSIS — J020 Streptococcal pharyngitis: Secondary | ICD-10-CM

## 2015-09-08 LAB — POCT RAPID STREP A (OFFICE): Rapid Strep A Screen: POSITIVE — AB

## 2015-09-08 MED ORDER — PENICILLIN V POTASSIUM 500 MG PO TABS: 500.0000 mg | ORAL_TABLET | Freq: Two times a day (BID) | ORAL | Status: DC

## 2015-09-08 NOTE — Progress Notes (Signed)
[facility] [address] [place] [address] [phone]  Date:  09/08/2015   Name:  Christy Alvarado   DOB:  [birth year]   MRN:  [id number]  PCP:  Kennon Portela, MD    Chief Complaint: Sore Throat   History of Present Illness:  Christy Alvarado is a 29 y.o. very pleasant female patient who presents with the following:  She is here today with a ST- history of strep in the past  She has noted a bad sore throat and that her throat also appreas swollen She has chills-she thinks that she had a fever at home but did not check it Took tyelnol at 7am No cough Hurts to swallow No GI symptoms LMP this week Generally in good health    Patient Active Problem List   Diagnosis Date Noted  . Anemia 09/01/2012  . Exercise-induced asthma 09/01/2012  . Depression   . ADD (attention deficit disorder)     Past Medical History  Diagnosis Date  . Depression   . ADD (attention deficit disorder)   . Seasonal allergies   . Anemia   . Asthma   . Anxiety     Past Surgical History  Procedure Laterality Date  . Reconstruction mandible / maxilla      underbite correction    Social History  Substance Use Topics  . Smoking status: Never Smoker   . Smokeless tobacco: Never Used  . Alcohol Use: 0.0 - 6.0 oz/week    0-10 Cans of beer per week    Family History  Problem Relation Age of Onset  . Cancer Father     prostate    No Known Allergies  Medication list has been reviewed and updated.  Current Outpatient Prescriptions on File Prior to Visit  Medication Sig Dispense Refill  . albuterol (PROVENTIL HFA;VENTOLIN HFA) 108 (90 BASE) MCG/ACT inhaler Inhale 2 puffs into the lungs every 6 (six) hours as needed for wheezing or shortness of breath.    . clonazePAM (KLONOPIN) 0.5 MG tablet Take 0.5-1 tablets (0.25-0.5 mg total) by mouth 2 (two) times daily as needed for anxiety. (Patient not taking: Reported on 09/08/2015) 20 tablet 0  . venlafaxine XR  (EFFEXOR-XR) 37.5 MG 24 hr capsule Take 1 capsule (37.5 mg total) by mouth daily. (Patient not taking: Reported on 09/08/2015) 30 capsule 3   No current facility-administered medications on file prior to visit.    Review of Systems:  As per HPI- otherwise negative.   Physical Examination: Filed Vitals:   09/08/15 1237  BP: 108/72  Pulse: 94  Temp: 99.1 F (37.3 C)  Resp: 18   Filed Vitals:   09/08/15 1237  Height: 5\' 2"  (1.575 m)   There is no weight on file to calculate BMI. Ideal Body Weight: Weight in (lb) to have BMI = 25: 136.4  GEN: WDWN, NAD, Non-toxic, A & O x 3, looks well HEENT: Atraumatic, Normocephalic. Neck supple. No masses, No LAD.  Bilateral TM wnl, oropharynx shows redness and exudate .  PEERL,EOMI.   Ears and Nose: No external deformity. CV: RRR, No M/G/R. No JVD. No thrill. No extra heart sounds. PULM: CTA B, no wheezes, crackles, rhonchi. No retractions. No resp. distress. No accessory muscle use. ABD: S, NT, ND. No rebound. No HSM. EXTR: No c/c/e NEURO Normal gait.  PSYCH: Normally interactive. Conversant. Not depressed or anxious appearing.  Calm demeanor.   Results for orders placed or performed in visit on 09/08/15  POCT rapid strep A  Result Value Ref Range   Rapid Strep A Screen Positive (A) Negative    Assessment and Plan: Streptococcal sore throat - Plan: penicillin v potassium (VEETID) 500 MG tablet  Pharyngitis - Plan: POCT rapid strep A  Treat for strep pharyngitis with penicillin and supportive care She will let us know if not better soon  Signed Lamar Blinks, MD

## 2015-09-08 NOTE — Patient Instructions (Signed)
You do have strep throat Take the penicillin as directed Use ibuprofen and/ or tylenol as needed  Rest, push fluids Let us know if you are not feeling better in the next couple of days

## 2015-10-30 ENCOUNTER — Ambulatory Visit (INDEPENDENT_AMBULATORY_CARE_PROVIDER_SITE_OTHER): Payer: 59 | Admitting: Internal Medicine

## 2015-10-30 VITALS — BP 112/66 | HR 90 | Temp 98.0°F | Resp 14 | Ht 62.0 in | Wt 138.0 lb

## 2015-10-30 DIAGNOSIS — F411 Generalized anxiety disorder: Secondary | ICD-10-CM

## 2015-10-30 DIAGNOSIS — J0100 Acute maxillary sinusitis, unspecified: Secondary | ICD-10-CM | POA: Diagnosis not present

## 2015-10-30 DIAGNOSIS — F329 Major depressive disorder, single episode, unspecified: Secondary | ICD-10-CM | POA: Diagnosis not present

## 2015-10-30 DIAGNOSIS — F32A Depression, unspecified: Secondary | ICD-10-CM

## 2015-10-30 DIAGNOSIS — F909 Attention-deficit hyperactivity disorder, unspecified type: Secondary | ICD-10-CM

## 2015-10-30 DIAGNOSIS — F988 Other specified behavioral and emotional disorders with onset usually occurring in childhood and adolescence: Secondary | ICD-10-CM

## 2015-10-30 MED ORDER — FLUOXETINE HCL 20 MG PO TABS: 20.0000 mg | ORAL_TABLET | Freq: Every day | ORAL | Status: DC

## 2015-10-30 MED ORDER — ALBUTEROL SULFATE HFA 108 (90 BASE) MCG/ACT IN AERS: 2.0000 | INHALATION_SPRAY | Freq: Four times a day (QID) | RESPIRATORY_TRACT | Status: DC | PRN

## 2015-10-30 MED ORDER — AMOXICILLIN 875 MG PO TABS: 875.0000 mg | ORAL_TABLET | Freq: Two times a day (BID) | ORAL | Status: DC

## 2015-10-30 NOTE — Progress Notes (Signed)
Subjective:  This chart was scribed for Tami Lin, MD by Thea Alken, ED Scribe. This patient was seen in room 3 and the patient's care was started at 2:52 PM.   Patient ID: Christy Alvarado, female    DOB: [birth year], 29 y.o.   MRN: [id number]  HPI   Chief Complaint  Patient presents with  . Breathed in a lot of smoke    in [place] when she was running a 10K  . Nasal Congestion    Began 2 weeks ago  . Sore Throat  . Medication Refill    albuterol inhaler  used to take Effexor XR and wants a refill, see depression screen    HPI Comment Christy Alvarado is a 29 y.o. female who presents to the [facility] complaining of a cough. Symptom started a couple weeks ago with a cough while in [place]. Cough persisted along with nasal congestion, sore throat and body aches. She denies drug allergies. Lots pnd/cong/face pain  Pt has been off of Effexor for over 6 months due to insurance issues. She has been having anxiety, more when she is at home alone. She states her mother has always pushed her to see a counselor as if something is wrong. Her father has hx of clinical depression.  She is making progress-Pt will be obtaining her associates degree next week and plans to attend [employer] next. ?arts management.  No current relationship issues. She enjoys folk music.    Depression screen [facility] 2/9 10/30/2015 09/08/2015  Decreased Interest 2 0  Down, Depressed, Hopeless 2 0  PHQ - 2 Score 4 0  Altered sleeping 1 -  Tired, decreased energy 1 -  Change in appetite 1 -  Feeling bad or failure about yourself  1 -  Trouble concentrating 1 -  Moving slowly or fidgety/restless 1 -  Suicidal thoughts 0 -  PHQ-9 Score 10 -   Had good resp to prozac in past as well Has avoided counsel Pt is also needing refill of albuterol inhaler.   Patient Active Problem List   Diagnosis Date Noted  . Anemia 09/01/2012  . Exercise-induced asthma 09/01/2012  . Depression   . ADD (attention  deficit disorder)     No Known Allergies Prior to Admission medications   Medication Sig Start Date End Date Taking? Authorizing Provider  albuterol (PROVENTIL HFA;VENTOLIN HFA) 108 (90 BASE) MCG/ACT inhaler Inhale 2 puffs into the lungs every 6 (six) hours as needed for wheezing or shortness of breath.   Yes Historical Provider, MD   Review of Systems  Constitutional: Negative for fever, chills and diaphoresis.  HENT: Positive for congestion and sore throat.   Respiratory: Positive for cough.   Psychiatric/Behavioral: The patient is nervous/anxious.    Objective:   Physical Exam  Constitutional: She is oriented to person, place, and time. She appears well-developed and well-nourished. No distress.  HENT:  Head: Normocephalic and atraumatic.  Mouth/Throat: Oropharynx is clear and moist. No oropharyngeal exudate.  Turbinates swollen and bilateral nasal discharge  Eyes: Conjunctivae and EOM are normal.  Neck: Neck supple.  Cardiovascular: Normal rate, regular rhythm and normal heart sounds.   Pulmonary/Chest: Effort normal and breath sounds normal. She has no wheezes. She has no rales.  Musculoskeletal: Normal range of motion.  Neurological: She is alert and oriented to person, place, and time.  Skin: Skin is warm and dry.  Psychiatric: She has a normal mood and affect. Her behavior is normal.  Nursing  note and vitals reviewed.   Filed Vitals:   10/30/15 1446  BP: 112/66  Pulse: 90  Temp: 98 F (36.7 C)  TempSrc: Oral  Resp: 14  Height: 5\' 2"  (1.575 m)  Weight: 138 lb (62.596 kg)  SpO2: 99%   Assessment & Plan:  ADD (attention deficit disorder)  Depression  GAD (generalized anxiety disorder)  Acute maxillary sinusitis, recurrence not specified  Hx RAD-needs ref  Meds ordered this encounter  Medications  . albuterol (PROVENTIL HFA;VENTOLIN HFA) 108 (90 BASE) MCG/ACT inhaler    Sig: Inhale 2 puffs into the lungs every 6 (six) hours as needed for wheezing or  shortness of breath.    Dispense:  1 Inhaler    Refill:  3  . amoxicillin (AMOXIL) 875 MG tablet    Sig: Take 1 tablet (875 mg total) by mouth 2 (two) times daily.    Dispense:  20 tablet    Refill:  0  . FLUoxetine (PROZAC) 20 MG tablet    Sig: Take 1 tablet (20 mg total) by mouth daily.    Dispense:  30 tablet    Refill:  3  Fu 3-6 w to discuss prozac w/ [name] or I  I have completed the patient encounter in its entirety as documented by the scribe, with editing by me where necessary. Kahliya Fraleigh P. Laney Pastor, M.D.  By signing my name below, I, Raven Small, attest that this documentation has been prepared under the direction and in the presence of Tami Lin, MD.  Electronically Signed: Thea Alken, ED Scribe. 10/30/2015. 3:09 PM.

## 2015-12-16 ENCOUNTER — Telehealth: Payer: Self-pay

## 2015-12-16 NOTE — Telephone Encounter (Signed)
Patient requesting her immunization records.  [id number] (H)

## 2015-12-19 NOTE — Telephone Encounter (Signed)
Patients immunizations records printed from [facility] ready for pickup.

## 2018-02-10 ENCOUNTER — Ambulatory Visit: Payer: Self-pay | Admitting: Family Medicine

## 2018-03-03 ENCOUNTER — Ambulatory Visit (INDEPENDENT_AMBULATORY_CARE_PROVIDER_SITE_OTHER): Payer: Managed Care, Other (non HMO) | Admitting: Family Medicine

## 2018-03-03 ENCOUNTER — Other Ambulatory Visit: Payer: Self-pay

## 2018-03-03 ENCOUNTER — Encounter: Payer: Self-pay | Admitting: Family Medicine

## 2018-03-03 VITALS — BP 116/78 | HR 66 | Temp 97.8°F | Resp 18 | Ht 63.0 in | Wt 142.6 lb

## 2018-03-03 DIAGNOSIS — Z862 Personal history of diseases of the blood and blood-forming organs and certain disorders involving the immune mechanism: Secondary | ICD-10-CM | POA: Diagnosis not present

## 2018-03-03 DIAGNOSIS — F330 Major depressive disorder, recurrent, mild: Secondary | ICD-10-CM | POA: Diagnosis not present

## 2018-03-03 DIAGNOSIS — R5383 Other fatigue: Secondary | ICD-10-CM | POA: Diagnosis not present

## 2018-03-03 MED ORDER — FLUOXETINE HCL 10 MG PO TABS: ORAL_TABLET | ORAL | 0 refills | Status: DC

## 2018-03-03 NOTE — Patient Instructions (Addendum)
Make an appointment with me for your complete physical and pap smear at any time that is good for you but lets make sure to do this within the next 6 months.  You can fast for that visit if you want your cholesterol checked (nothing to eat after midnight the evening prior and only water or black coffee) but it is fine to forgo that test for another few years if your appointment is in the afternoon.    IF you received an x-ray today, you will receive an invoice from [facility]. Please contact [facility] at [phone] with questions or concerns regarding your invoice.   IF you received labwork today, you will receive an invoice from [facility]. Please contact [facility] at [phone] with questions or concerns regarding your invoice.   Our billing staff will not be able to assist you with questions regarding bills from these companies.  You will be contacted with the lab results as soon as they are available. The fastest way to get your results is to activate your My Chart account. Instructions are located on the last page of this paperwork. If you have not heard from [place] regarding the results in 2 weeks, please contact this office.     Iron-Rich Diet Iron is a mineral that helps your body to produce hemoglobin. Hemoglobin is a protein in your red blood cells that carries oxygen to your body's tissues. Eating too little iron may cause you to feel weak and tired, and it can increase your risk for infection. Eating enough iron is necessary for your body's metabolism, muscle function, and nervous system. Iron is naturally found in many foods. It can also be added to foods or fortified in foods. There are two types of dietary iron:  Heme iron. Heme iron is absorbed by the body more easily than nonheme iron. Heme iron is found in meat, poultry, and fish.  Nonheme iron. Nonheme iron is found in dietary supplements, iron-fortified grains, beans, and vegetables.  You may need to follow  an iron-rich diet if:  You have been diagnosed with iron deficiency or iron-deficiency anemia.  You have a condition that prevents you from absorbing dietary iron, such as: ? Infection in your intestines. ? Celiac disease. This involves long-lasting (chronic) inflammation of your intestines.  You do not eat enough iron.  You eat a diet that is high in foods that impair iron absorption.  You have lost a lot of blood.  You have heavy bleeding during your menstrual cycle.  You are pregnant.  What is my plan? Your health care provider may help you to determine how much iron you need per day based on your condition. Generally, when a person consumes sufficient amounts of iron in the diet, the following iron needs are met:  Men. ? 21-46 years old: 11 mg per day. ? 23-84 years old: 8 mg per day.  Women. ? 72-62 years old: 15 mg per day. ? 65-59 years old: 18 mg per day. ? Over 34 years old: 8 mg per day. ? Pregnant women: 27 mg per day. ? Breastfeeding women: 9 mg per day.  What do I need to know about an iron-rich diet?  Eat fresh fruits and vegetables that are high in vitamin C along with foods that are high in iron. This will help increase the amount of iron that your body absorbs from food, especially with foods containing nonheme iron. Foods that are high in vitamin C include oranges, peppers, tomatoes, and mango.  Take iron supplements only as directed by your health care provider. Overdose of iron can be life-threatening. If you were prescribed iron supplements, take them with orange juice or a vitamin C supplement.  Cook foods in pots and pans that are made from iron.  Eat nonheme iron-containing foods alongside foods that are high in heme iron. This helps to improve your iron absorption.  Certain foods and drinks contain compounds that impair iron absorption. Avoid eating these foods in the same meal as iron-rich foods or with iron supplements. These include: ? Coffee,  black tea, and red wine. ? Milk, dairy products, and foods that are high in calcium. ? Beans, soybeans, and peas. ? Whole grains.  When eating foods that contain both nonheme iron and compounds that impair iron absorption, follow these tips to absorb iron better. ? Soak beans overnight before cooking. ? Soak whole grains overnight and drain them before using. ? Ferment flours before baking, such as using yeast in bread dough. What foods can I eat? Grains Iron-fortified breakfast cereal. Iron-fortified whole-wheat bread. Enriched rice. Sprouted grains. Vegetables Spinach. Potatoes with skin. Green peas. Broccoli. Red and green bell peppers. Fermented vegetables. Fruits Prunes. Raisins. Oranges. Strawberries. Mango. Grapefruit. Meats and Other Protein Sources Beef liver. Oysters. Beef. Shrimp. [place]. Chicken. [place]. Sardines. Chickpeas. Nuts. Tofu. Beverages Tomato juice. Fresh orange juice. Prune juice. Hibiscus tea. Fortified instant breakfast shakes. Condiments Tahini. Fermented soy sauce. Sweets and Desserts Black-strap molasses. Other Wheat germ. The items listed above may not be a complete list of recommended foods or beverages. Contact your dietitian for more options. What foods are not recommended? Grains Whole grains. Bran cereal. Bran flour. Oats. Vegetables Artichokes. Brussels sprouts. Kale. Fruits Blueberries. Raspberries. Strawberries. Figs. Meats and Other Protein Sources Soybeans. Products made from soy protein. Dairy Milk. Cream. Cheese. Yogurt. Cottage cheese. Beverages Coffee. Black tea. Red wine. Sweets and Desserts Cocoa. Chocolate. Ice cream. Other Basil. Oregano. Parsley. The items listed above may not be a complete list of foods and beverages to avoid. Contact your dietitian for more information. This information is not intended to replace advice given to you by your health care provider. Make sure you discuss any questions you have with your health  care provider. Document Released: 07/03/2005 Document Revised: 06/08/2016 Document Reviewed: 06/16/2014 Elsevier Interactive Patient Education  2018 [employer].  Fatigue Fatigue is feeling tired all of the time, a lack of energy, or a lack of motivation. Occasional or mild fatigue is often a normal response to activity or life in general. However, long-lasting (chronic) or extreme fatigue may indicate an underlying medical condition. Follow these instructions at home: Watch your fatigue for any changes. The following actions may help to lessen any discomfort you are feeling:  Talk to your health care provider about how much sleep you need each night. Try to get the required amount every night.  Take medicines only as directed by your health care provider.  Eat a healthy and nutritious diet. Ask your health care provider if you need help changing your diet.  Drink enough fluid to keep your urine clear or pale yellow.  Practice ways of relaxing, such as yoga, meditation, massage therapy, or acupuncture.  Exercise regularly.  Change situations that cause you stress. Try to keep your work and personal routine reasonable.  Do not abuse illegal drugs.  Limit alcohol intake to no more than 1 drink per day for nonpregnant women and 2 drinks per day for men. One drink equals 12 ounces of  beer, 5 ounces of wine, or 1 ounces of hard liquor.  Take a multivitamin, if directed by your health care provider.  Contact a health care provider if:  Your fatigue does not get better.  You have a fever.  You have unintentional weight loss or gain.  You have headaches.  You have difficulty: ? Falling asleep. ? Sleeping throughout the night.  You feel angry, guilty, anxious, or sad.  You are unable to have a bowel movement (constipation).  You skin is dry.  Your legs or another part of your body is swollen. Get help right away if:  You feel confused.  Your vision is blurry.  You  feel faint or pass out.  You have a severe headache.  You have severe abdominal, pelvic, or back pain.  You have chest pain, shortness of breath, or an irregular or fast heartbeat.  You are unable to urinate or you urinate less than normal.  You develop abnormal bleeding, such as bleeding from the rectum, vagina, nose, lungs, or nipples.  You vomit blood.  You have thoughts about harming yourself or committing suicide.  You are worried that you might harm someone else. This information is not intended to replace advice given to you by your health care provider. Make sure you discuss any questions you have with your health care provider. Document Released: 09/16/2007 Document Revised: 04/26/2016 Document Reviewed: 03/23/2014 Elsevier Interactive Patient Education  [employer].

## 2018-03-03 NOTE — Progress Notes (Signed)
Subjective:  By signing my name below, I, Christy Alvarado, attest that this documentation has been prepared under the direction and in the presence of Christy Cheadle, MD. Electronically Signed: Moises Alvarado, [place]. 03/03/2018 , 5:02 PM .  Patient was seen in Room 1 .   Patient ID: Christy Alvarado, female    DOB: 08-24-1986, 32 y.o.   MRN: [id number] Chief Complaint  Patient presents with  . Establish Care  . Depression    Depression scale score 4  . Medication Refill    Prozac 20 MG   HPI Christy Alvarado is a 32 y.o. female who presents to Primary Care at [facility] to establish care. Patient has been taking Prozac 20mg  intermittently since 8th grade. She's tried other anti-depressants in the past, but Prozac worked well for her; notes feeling nauseous when taking another anti-depressants, doesn't recall which it was. She also informs being on higher doses when she was attending college at [employer], but also made her nauseated and dizziness. She states she's been off of her medication for about 10 months now due to change and lapse in insurance. She recently graduated from [employer] with degree in arts administration in May. She's felt her symptoms slowly creeping back. She sees her therapist every 2 weeks, Westley Hummer; has seen her since high school intermittently. She mentions taking Effexor in the past.   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   Anemia She has history of anemia since 28-17 years old. She took iron supplements at the time, but hasn't been taking it since. She notes feeling sluggish and draggy on most days. She's a regular runner for exercise.   Vitamins/Medications She takes Loratidine daily.   History of asthma She hasn't used her albuterol inhaler in a long time; childhood asthma.   Family history Father: had prostate cancer Paternal grandfather: mouth cancer, smoking history  Health maintenance Last Alvarado work  done several years ago, about 5+ years ago.  Last pap smear done in 2013. She did not see gynecology for colposcopy. Her menses have been regular. Currently not sexually active. She's not on birth control.   Past Medical History:  Diagnosis Date  . ADD (attention deficit disorder)   . Anemia   . Anxiety   . Asthma   . Depression   . Seasonal allergies    Past Surgical History:  Procedure Laterality Date  . RECONSTRUCTION MANDIBLE / MAXILLA     underbite correction   Prior to Admission medications   Medication Sig Start Date End Date Taking? Authorizing Provider  FLUoxetine (PROZAC) 20 MG tablet Take 1 tablet (20 mg total) by mouth daily. 10/30/15  Yes Leandrew Koyanagi, MD  albuterol (PROVENTIL HFA;VENTOLIN HFA) 108 (90 BASE) MCG/ACT inhaler Inhale 2 puffs into the lungs every 6 (six) hours as needed for wheezing or shortness of breath. Patient not taking: Reported on 03/03/2018 10/30/15   Leandrew Koyanagi, MD  amoxicillin (AMOXIL) 875 MG tablet Take 1 tablet (875 mg total) by mouth 2 (two) times daily. Patient not taking: Reported on 03/03/2018 10/30/15   Leandrew Koyanagi, MD   No Known Allergies Family History  Problem Relation Age of Onset  . Cancer Father        prostate   Social History   Socioeconomic History  . Marital status: Single    Spouse name: n/a  . Number of children: 0  . Years of education: 15+  .  Highest education level: Not on file  Occupational History  . Occupation: Metallurgist: [place]  Social Needs  . Financial resource strain: Not on file  . Food insecurity:    Worry: Not on file    Inability: Not on file  . Transportation needs:    Medical: Not on file    Non-medical: Not on file  Tobacco Use  . Smoking status: Never Smoker  . Smokeless tobacco: Never Used  Substance and Sexual Activity  . Alcohol use: Yes    Alcohol/week: 0.0 - 6.0 oz  . Drug use: No  . Sexual activity:  Yes    Partners: Male    Birth control/protection: Condom  Lifestyle  . Physical activity:    Days per week: Not on file    Minutes per session: Not on file  . Stress: Not on file  Relationships  . Social connections:    Talks on phone: Not on file    Gets together: Not on file    Attends religious service: Not on file    Active member of club or organization: Not on file    Attends meetings of clubs or organizations: Not on file    Relationship status: Not on file  Other Topics Concern  . Not on file  Social History Narrative   Lives with 2 sisters (and soon-to-be brother-in-law) in the house next-door to their parents.   Depression screen [facility] 2/9 03/03/2018 10/30/2015 09/08/2015  Decreased Interest 0 2 0  Down, Depressed, Hopeless 1 2 0  PHQ - 2 Score 1 4 0  Altered sleeping 1 1 -  Tired, decreased energy 1 1 -  Change in appetite 0 1 -  Feeling bad or failure about yourself  1 1 -  Trouble concentrating 0 1 -  Moving slowly or fidgety/restless 0 1 -  Suicidal thoughts 0 0 -  PHQ-9 Score 4 10 -  Difficult doing work/chores Not difficult at all - -    Review of Systems  Constitutional: Negative for chills, fatigue, fever and unexpected weight change.  Respiratory: Negative for cough.   Gastrointestinal: Negative for constipation, diarrhea, nausea and vomiting.  Skin: Negative for rash and wound.  Neurological: Negative for dizziness, weakness and headaches.       Objective:   Physical Exam  Constitutional: She is oriented to person, place, and time. She appears well-developed and well-nourished. No distress.  HENT:  Head: Normocephalic and atraumatic.  Eyes: Pupils are equal, round, and reactive to light. EOM are normal.  Neck: Neck supple.  Cardiovascular: Normal rate.  Pulmonary/Chest: Effort normal. No respiratory distress.  Musculoskeletal: Normal range of motion.  Neurological: She is alert and oriented to person, place, and time.  Skin: Skin is warm and  dry.  Psychiatric: She has a normal mood and affect. Her behavior is normal.  Nursing note and vitals reviewed.   BP 116/78 (BP Location: Left Arm, Patient Position: Sitting, Cuff Size: Normal)   Pulse 66   Temp 97.8 F (36.6 C) (Oral)   Resp 18   Ht 5\' 3"  (1.6 m)   Wt 142 lb 9.6 oz (64.7 kg)   LMP 02/17/2018   SpO2 96%   BMI 25.26 kg/m      Assessment & Plan:  change 10mg  to 20mg , send in 90 day supply when refill requested in 1 month 1. Fatigue, unspecified type   2. History of iron deficiency anemia  3. Mild episode of recurrent major depressive disorder ([place])     Orders Placed This Encounter  Procedures  . CBC with Differential/Platelet  . TSH  . Comprehensive metabolic panel  . Ferritin    Meds ordered this encounter  Medications  . DISCONTD: FLUoxetine (PROZAC) 10 MG tablet    Sig: Start 1 tab po qd x2- 3 weeks then increase to 2 tabs po qd    Dispense:  45 tablet    Refill:  0    I personally performed the services described in this documentation, which was scribed in my presence. The recorded information has been reviewed and considered, and addended by me as needed.   Christy Alvarado, M.D.  Primary Care at [facility] [address] [place], [place] [address] [phone] phone [phone] fax  05/05/18 9:41 AM

## 2018-03-04 LAB — FERRITIN: Ferritin: 8 ng/mL — ABNORMAL LOW (ref 15–150)

## 2018-03-04 LAB — COMPREHENSIVE METABOLIC PANEL
A/G RATIO: 1.9 (ref 1.2–2.2)
ALBUMIN: 4.9 g/dL (ref 3.5–5.5)
ALK PHOS: 48 IU/L (ref 39–117)
ALT: 24 IU/L (ref 0–32)
AST: 25 IU/L (ref 0–40)
BUN / CREAT RATIO: 10 (ref 9–23)
BUN: 8 mg/dL (ref 6–20)
Bilirubin Total: 0.5 mg/dL (ref 0.0–1.2)
CHLORIDE: 101 mmol/L (ref 96–106)
CO2: 19 mmol/L — ABNORMAL LOW (ref 20–29)
Calcium: 9.3 mg/dL (ref 8.7–10.2)
Creatinine, Ser: 0.8 mg/dL (ref 0.57–1.00)
GFR calc non Af Amer: 100 mL/min/{1.73_m2} (ref >59)
GFR, EST AFRICAN AMERICAN: 115 mL/min/{1.73_m2} (ref >59)
Globulin, Total: 2.6 g/dL (ref 1.5–4.5)
Glucose: 85 mg/dL (ref 65–99)
POTASSIUM: 4 mmol/L (ref 3.5–5.2)
SODIUM: 140 mmol/L (ref 134–144)
TOTAL PROTEIN: 7.5 g/dL (ref 6.0–8.5)

## 2018-03-04 LAB — CBC WITH DIFFERENTIAL/PLATELET
BASOS ABS: 0.1 10*3/uL (ref 0.0–0.2)
Basos: 1 %
EOS (ABSOLUTE): 0.2 10*3/uL (ref 0.0–0.4)
Eos: 2 %
HEMATOCRIT: 40.8 % (ref 34.0–46.6)
HEMOGLOBIN: 12.6 g/dL (ref 11.1–15.9)
Immature Grans (Abs): 0 10*3/uL (ref 0.0–0.1)
Immature Granulocytes: 0 %
LYMPHS ABS: 3.1 10*3/uL (ref 0.7–3.1)
Lymphs: 38 %
MCH: 26.6 pg (ref 26.6–33.0)
MCHC: 30.9 g/dL — AB (ref 31.5–35.7)
MCV: 86 fL (ref 79–97)
MONOS ABS: 0.5 10*3/uL (ref 0.1–0.9)
Monocytes: 6 %
NEUTROS ABS: 4.2 10*3/uL (ref 1.4–7.0)
Neutrophils: 53 %
Platelets: 238 10*3/uL (ref 150–379)
RBC: 4.74 x10E6/uL (ref 3.77–5.28)
RDW: 14.2 % (ref 12.3–15.4)
WBC: 8.1 10*3/uL (ref 3.4–10.8)

## 2018-03-04 LAB — TSH: TSH: 1.01 u[IU]/mL (ref 0.450–4.500)

## 2018-03-23 ENCOUNTER — Other Ambulatory Visit: Payer: Self-pay | Admitting: Family Medicine

## 2018-05-05 ENCOUNTER — Ambulatory Visit (INDEPENDENT_AMBULATORY_CARE_PROVIDER_SITE_OTHER): Payer: Managed Care, Other (non HMO) | Admitting: Family Medicine

## 2018-05-05 ENCOUNTER — Telehealth: Payer: Self-pay | Admitting: Obstetrics and Gynecology

## 2018-05-05 ENCOUNTER — Encounter: Payer: Self-pay | Admitting: Family Medicine

## 2018-05-05 VITALS — BP 106/78 | HR 67 | Temp 98.0°F | Resp 16 | Ht 63.0 in | Wt 138.6 lb

## 2018-05-05 DIAGNOSIS — D500 Iron deficiency anemia secondary to blood loss (chronic): Secondary | ICD-10-CM | POA: Diagnosis not present

## 2018-05-05 DIAGNOSIS — Z1383 Encounter for screening for respiratory disorder NEC: Secondary | ICD-10-CM

## 2018-05-05 DIAGNOSIS — Z136 Encounter for screening for cardiovascular disorders: Secondary | ICD-10-CM | POA: Diagnosis not present

## 2018-05-05 DIAGNOSIS — F330 Major depressive disorder, recurrent, mild: Secondary | ICD-10-CM | POA: Diagnosis not present

## 2018-05-05 DIAGNOSIS — Z0000 Encounter for general adult medical examination without abnormal findings: Secondary | ICD-10-CM

## 2018-05-05 DIAGNOSIS — N888 Other specified noninflammatory disorders of cervix uteri: Secondary | ICD-10-CM

## 2018-05-05 DIAGNOSIS — Z113 Encounter for screening for infections with a predominantly sexual mode of transmission: Secondary | ICD-10-CM | POA: Diagnosis not present

## 2018-05-05 DIAGNOSIS — Z1389 Encounter for screening for other disorder: Secondary | ICD-10-CM | POA: Diagnosis not present

## 2018-05-05 DIAGNOSIS — Z8742 Personal history of other diseases of the female genital tract: Secondary | ICD-10-CM

## 2018-05-05 DIAGNOSIS — Z124 Encounter for screening for malignant neoplasm of cervix: Secondary | ICD-10-CM

## 2018-05-05 DIAGNOSIS — F988 Other specified behavioral and emotional disorders with onset usually occurring in childhood and adolescence: Secondary | ICD-10-CM | POA: Diagnosis not present

## 2018-05-05 DIAGNOSIS — Z3009 Encounter for other general counseling and advice on contraception: Secondary | ICD-10-CM

## 2018-05-05 DIAGNOSIS — Z87898 Personal history of other specified conditions: Secondary | ICD-10-CM

## 2018-05-05 LAB — POCT URINALYSIS DIP (MANUAL ENTRY)
BILIRUBIN UA: NEGATIVE
BILIRUBIN UA: NEGATIVE mg/dL
Blood, UA: NEGATIVE
Glucose, UA: NEGATIVE mg/dL
Nitrite, UA: NEGATIVE
PROTEIN UA: NEGATIVE mg/dL
Spec Grav, UA: 1.025 (ref 1.010–1.025)
Urobilinogen, UA: 0.2 E.U./dL
pH, UA: 6 (ref 5.0–8.0)

## 2018-05-05 MED ORDER — NORETHIN ACE-ETH ESTRAD-FE 1-20 MG-MCG PO TABS: 1.0000 | ORAL_TABLET | Freq: Every day | ORAL | 11 refills | Status: DC

## 2018-05-05 MED ORDER — METHYLPHENIDATE HCL ER 18 MG PO TB24: 18.0000 mg | ORAL_TABLET | Freq: Every day | ORAL | 0 refills | Status: DC

## 2018-05-05 MED ORDER — FLUOXETINE HCL 20 MG PO TABS: 20.0000 mg | ORAL_TABLET | Freq: Every day | ORAL | 3 refills | Status: DC

## 2018-05-05 NOTE — Patient Instructions (Addendum)
You are deficient on iron though not yet causing an anemia (low red blood cells).  I recommend starting a daily iron supplement like ferrous sulfate 321m (662mof elemental iron) which has the highest iron dose of the products commonly available over-the-counter.  It is best absorbed on an empty stomach 30 minutes before eating or 2 hours after a meal with a vitamin C supplement or a small glass of orange juice. This can cause significant constipation in some patients, in which case you may need to switch to a slow release, lower dose supplement like Nu-Iron or Slow-Fe.  I have also included a list of high iron foods below.      IF you received an x-ray today, you will receive an invoice from [facility]. Please contact [facility] at [phone] questions or concerns regarding your invoice.   IF you received labwork today, you will receive an invoice from [facility] at [phone] questions or concerns regarding your invoice.   Our billing staff will not be able to assist you with questions regarding bills from these companies.  You will be contacted with the lab results as soon as they are available. The fastest way to get your results is to activate your My Chart account. Instructions are located on the last page of this paperwork. If you have not heard from [place] the results in 2 weeks, please contact this office.    Keeping You Healthy  Get These Tests 1. Blood Pressure- Have your blood pressure checked once a year by your health care provider.  Normal blood pressure is 120/80. 2. Weight- Have your body mass index (BMI) calculated to screen for obesity.  BMI is measure of body fat based on height and weight.  You can also calculate your own BMI at wwGravelBags.it3. Cholesterol- Have your cholesterol checked every 5 years starting at age 3748hen yearly starting at age 36844.52Chlamydia, HIV, and other sexually  transmitted diseases- Get screened every year until age 5273then within three months of each new sexual provider. 5. Pap Test - Every 1-5 years; discuss with your health care provider. 6. Mammogram- Every 1-2 years starting at age 30--50Take these medicines  Calcium with Vitamin D-Your body needs 1200 mg of Calcium each day and 9852994006 IU of Vitamin D daily.  Your body can only absorb 500 mg of Calcium at a time so Calcium must be taken in 2 or 3 divided doses throughout the day.  Multivitamin with folic acid- Once daily if it is possible for you to become pregnant.  Get these Immunizations  Gardasil-Series of three doses; prevents HPV related illness such as genital warts and cervical cancer.  Menactra-Single dose; prevents meningitis.  Tetanus shot- Every 10 years.  Flu shot-Every year.  Take these steps 1. Do not smoke-Your healthcare provider can help you quit.  For tips on how to quit go to www.smokefree.gov or call 1-800 QUITNOW. 2. Be physically active- Exercise 5 days a week for at least 30 minutes.  If you are not already physically active, start slow and gradually work up to 30 minutes of moderate physical activity.  Examples of moderate activity include walking briskly, dancing, swimming, bicycling, etc. 3. Breast Cancer- A self breast exam every month is important for early detection of breast cancer.  For more information and instruction on self breast exams, ask your healthcare provider or wwhttps://www.patel.info/4. Eat a healthy diet- Eat a variety of healthy foods  such as fruits, vegetables, whole grains, low fat milk, low fat cheeses, yogurt, lean meats, poultry and fish, beans, nuts, tofu, etc.  For more information go to www. Thenutritionsource.org 5. Drink alcohol in moderation- Limit alcohol intake to one drink or less per day. Never drink and drive. 6. Depression- Your emotional health is as important as your physical health.  If you're  feeling down or losing interest in things you normally enjoy please talk to your healthcare provider about being screened for depression. 7. Dental visit- Brush and floss your teeth twice daily; visit your dentist twice a year. 8. Eye doctor- Get an eye exam at least every 2 years. 9. Helmet use- Always wear a helmet when riding a bicycle, motorcycle, rollerblading or skateboarding. 39. Safe sex- If you may be exposed to sexually transmitted infections, use a condom. 11. Seat belts- Seat belts can save your live; always wear one. 12. Smoke/Carbon Monoxide detectors- These detectors need to be installed on the appropriate level of your home. Replace batteries at least once a year. 13. Skin cancer- When out in the sun please cover up and use sunscreen 15 SPF or higher. 14. Violence- If anyone is threatening or hurting you, please tell your healthcare provider.        Iron-Rich Diet Iron is a mineral that helps your body to produce hemoglobin. Hemoglobin is a protein in your red blood cells that carries oxygen to your body's tissues. Eating too little iron may cause you to feel weak and tired, and it can increase your risk for infection. Eating enough iron is necessary for your body's metabolism, muscle function, and nervous system. Iron is naturally found in many foods. It can also be added to foods or fortified in foods. There are two types of dietary iron:  Heme iron. Heme iron is absorbed by the body more easily than nonheme iron. Heme iron is found in meat, poultry, and fish.  Nonheme iron. Nonheme iron is found in dietary supplements, iron-fortified grains, beans, and vegetables.  You may need to follow an iron-rich diet if:  You have been diagnosed with iron deficiency or iron-deficiency anemia.  You have a condition that prevents you from absorbing dietary iron, such as: ? Infection in your intestines. ? Celiac disease. This involves long-lasting (chronic) inflammation of your  intestines.  You do not eat enough iron.  You eat a diet that is high in foods that impair iron absorption.  You have lost a lot of blood.  You have heavy bleeding during your menstrual cycle.  You are pregnant.  What is my plan? Your health care provider may help you to determine how much iron you need per day based on your condition. Generally, when a person consumes sufficient amounts of iron in the diet, the following iron needs are met:  Men. ? 66-38 years old: 11 mg per day. ? 53-79 years old: 8 mg per day.  Women. ? 7-2 years old: 15 mg per day. ? 37-75 years old: 18 mg per day. ? Over 46 years old: 8 mg per day. ? Pregnant women: 27 mg per day. ? Breastfeeding women: 9 mg per day.  What do I need to know about an iron-rich diet?  Eat fresh fruits and vegetables that are high in vitamin C along with foods that are high in iron. This will help increase the amount of iron that your body absorbs from food, especially with foods containing nonheme iron. Foods that are high in vitamin C  include oranges, peppers, tomatoes, and mango.  Take iron supplements only as directed by your health care provider. Overdose of iron can be life-threatening. If you were prescribed iron supplements, take them with orange juice or a vitamin C supplement.  Cook foods in pots and pans that are made from iron.  Eat nonheme iron-containing foods alongside foods that are high in heme iron. This helps to improve your iron absorption.  Certain foods and drinks contain compounds that impair iron absorption. Avoid eating these foods in the same meal as iron-rich foods or with iron supplements. These include: ? Coffee, black tea, and red wine. ? Milk, dairy products, and foods that are high in calcium. ? Beans, soybeans, and peas. ? Whole grains.  When eating foods that contain both nonheme iron and compounds that impair iron absorption, follow these tips to absorb iron better. ? Soak beans  overnight before cooking. ? Soak whole grains overnight and drain them before using. ? Ferment flours before baking, such as using yeast in bread dough. What foods can I eat? Grains Iron-fortified breakfast cereal. Iron-fortified whole-wheat bread. Enriched rice. Sprouted grains. Vegetables Spinach. Potatoes with skin. Green peas. Broccoli. Red and green bell peppers. Fermented vegetables. Fruits Prunes. Raisins. Oranges. Strawberries. Mango. Grapefruit. Meats and Other Protein Sources Beef liver. Oysters. Beef. Shrimp. [place]. Chicken. [place]. Sardines. Chickpeas. Nuts. Tofu. Beverages Tomato juice. Fresh orange juice. Prune juice. Hibiscus tea. Fortified instant breakfast shakes. Condiments Tahini. Fermented soy sauce. Sweets and Desserts Black-strap molasses. Other Wheat germ. The items listed above may not be a complete list of recommended foods or beverages. Contact your dietitian for more options. What foods are not recommended? Grains Whole grains. Bran cereal. Bran flour. Oats. Vegetables Artichokes. Brussels sprouts. Kale. Fruits Blueberries. Raspberries. Strawberries. Figs. Meats and Other Protein Sources Soybeans. Products made from soy protein. Dairy Milk. Cream. Cheese. Yogurt. Cottage cheese. Beverages Coffee. Black tea. Red wine. Sweets and Desserts Cocoa. Chocolate. Ice cream. Other Basil. Oregano. Parsley. The items listed above may not be a complete list of foods and beverages to avoid. Contact your dietitian for more information. This information is not intended to replace advice given to you by your health care provider. Make sure you discuss any questions you have with your health care provider. Document Released: 07/03/2005 Document Revised: 06/08/2016 Document Reviewed: 06/16/2014 Elsevier Interactive Patient Education  [employer].

## 2018-05-05 NOTE — Telephone Encounter (Signed)
Called and left a message for patient to call back to schedule this "urgent" new patient doctor referral appointment with our office to see any provider for: Cervical mass.

## 2018-05-05 NOTE — Progress Notes (Addendum)
Subjective:  By signing my name below, I, Essence Howell, attest that this documentation has been prepared under the direction and in the presence of [name], MD Electronically Signed: Ladene Artist, ED Scribe 05/05/2018 at 9:48 AM.   Patient ID: Christy Alvarado, female    DOB: May 11, 1986, 32 y.o.   MRN: [id number]  Chief Complaint  Patient presents with  . Annual Exam    w/pap   HPI Christy Alvarado is a 32 y.o. female who presents to Primary Care at [facility] for an annual exam. Last seen 2 months ago for eval of fatigue.  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  States her menses reg.  Is beginning her menses today but flow still very light Family Planning: Not on BC. She has prev tried BC pill, considering IUD but would like to restart BC pills while waiting IUD placement STI screening: Currently sexually active - now in new relationship Tobacco use/EtOH/substances: Weight/Blood sugar/Diet/Exercise: BMI Readings from Last 3 Encounters:  05/05/18 24.55 kg/m  03/03/18 25.26 kg/m  10/30/15 25.24 kg/m   No results found for: HGBA1C OTC/Vit/Supp/Herbal: Did not restart iron supplement, no multivitamins or supplements Dentist/Optho: Immunizations:  Immunization History  Administered Date(s) Administered  . Influenza Split 09/01/2012   Chronic Medical Conditions: ADD - Pt was on Concerta 18 while in school. Recently had a career change which requires more desk work. She has noticed difficulty staying focused. Her therapist advised restarting Concerta. She has not tried any other meds for ADD. Anxiety/Depression - Doing well on Prozac 20.  Past Medical History:  Diagnosis Date  . ADD (attention deficit disorder)   . Allergy   . Anemia   . Anxiety   . Asthma   . Depression   . Seasonal allergies    Past Surgical History:  Procedure Laterality Date  .  RECONSTRUCTION MANDIBLE / MAXILLA     underbite correction   Current Outpatient Medications on File Prior to Visit  Medication Sig Dispense Refill  . albuterol (PROVENTIL HFA;VENTOLIN HFA) 108 (90 BASE) MCG/ACT inhaler Inhale 2 puffs into the lungs every 6 (six) hours as needed for wheezing or shortness of breath. (Patient not taking: Reported on 03/03/2018) 1 Inhaler 3   No current facility-administered medications on file prior to visit.    No Known Allergies Family History  Problem Relation Age of Onset  . Cancer Father        prostate  . Heart disease Maternal Grandmother   . Hyperlipidemia Maternal Grandmother   . Stroke Maternal Grandmother    Social History   Socioeconomic History  . Marital status: Single    Spouse name: n/a  . Number of children: 0  . Years of education: 15+  . Highest education level: Not on file  Occupational History  . 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 Financial resource strain: Not on file  . Food insecurity:    Worry: Not on file    Inability: Not on file  . Transportation needs:    Medical: Not on file    Non-medical: Not on file  Tobacco Use  . Smoking status: Never Smoker  . Smokeless tobacco: Never Used  Substance and Sexual Activity  . Alcohol use: Yes    Alcohol/week: 3.0 oz  Types: 5 Cans of beer per week  . Drug use: No  . Sexual activity: Yes    Partners: Male    Birth control/protection: Condom  Lifestyle  . Physical activity:    Days per week: Not on file    Minutes per session: Not on file  . Stress: Not on file  Relationships  . Social connections:    Talks on phone: Not on file    Gets together: Not on file    Attends religious service: Not on file    Active member of club or organization: Not on file    Attends meetings of clubs or organizations: Not on file    Relationship status: Not on file  Other Topics Concern  . Not on file    Social History Narrative   Lives with 2 sisters (and soon-to-be brother-in-law) in the house next-door to their parents.   Depression screen [facility] 2/9 05/05/2018 03/03/2018 10/30/2015 09/08/2015  Decreased Interest 0 0 2 0  Down, Depressed, Hopeless 0 1 2 0  PHQ - 2 Score 0 1 4 0  Altered sleeping - 1 1 -  Tired, decreased energy - 1 1 -  Change in appetite - 0 1 -  Feeling bad or failure about yourself  - 1 1 -  Trouble concentrating - 0 1 -  Moving slowly or fidgety/restless - 0 1 -  Suicidal thoughts - 0 0 -  PHQ-9 Score - 4 10 -  Difficult doing work/chores - Not difficult at all - -   Review of Systems  Allergic/Immunologic: Positive for environmental allergies.  Psychiatric/Behavioral: Positive for decreased concentration.      Objective:   Physical Exam  Constitutional: She is oriented to person, place, and time. She appears well-developed and well-nourished. No distress.  HENT:  Head: Normocephalic and atraumatic.  Nose: Rhinorrhea (and erythema) present.  Eyes: Conjunctivae and EOM are normal.  Neck: Neck supple. No tracheal deviation present.  Cardiovascular: Normal rate.  Pulmonary/Chest: Effort normal. No respiratory distress.  Genitourinary:  Genitourinary Comments: Cervix: very posterior with anteverted uterus. Appears very friable though first day of menses. Firm 1 cm friable mass protruding from cervical os. Uterus was proximal with cervical os unable to reach on bimanual exam. Uterus felt fixed but unable to palpate on exam.  Musculoskeletal: Normal range of motion.  Neurological: She is alert and oriented to person, place, and time.  Skin: Skin is warm and dry.  Psychiatric: She has a normal mood and affect. Her behavior is normal.  Nursing note and vitals reviewed.   BP 106/78 (BP Location: Left Arm, Patient Position: Sitting, Cuff Size: Normal)   Pulse 67   Temp 98 F (36.7 C) (Oral)   Resp 16   Ht 5\' 3"  (1.6 m)   Wt 138 lb 9.6 oz (62.9 kg)   LMP  04/11/2018   SpO2 96%   BMI 24.55 kg/m     Assessment & Plan:   1. Annual physical exam   2. Mild episode of recurrent major depressive disorder ([place]) - doing great on prozac 20 - cont, seeing therapist regularly  3. Iron deficiency anemia due to chronic blood loss - iron low but not yet causing anemia -rec iron supp x 1 mo only to build back stores - starting OCPs and poss IUD down the line should also help  4. Screening for cervical cancer   5. Routine screening for STI (sexually transmitted infection)   6. Screening for cardiovascular, respiratory, and genitourinary  diseases   7. Cervical mass - friable firm tissue aprox 1 cm round protruding from cervical os and is flush with external os.  Unable to manipulate much so seems fixed more distally in os though is not proximally adhered. Pt reports starting of menses today so difficult to assess true degree of "friability" though does appear friable compared to other cervix - tissue on mass rough - with freq grooves or villi (almost like tongue) - not smooth like cervical or vaginal mucosa.  This is highly concerning in light of pt's abnml LGSIL pap smear 2013 that needed colpo but was lost to f/u- so naturally I am worried about a higher grade malignant lesion - will place urgent referral to Dr. Talbert Nan at [facility].  8. History of abnormal cervical Pap smear - 2013 [facility]SI rec to have colpo - pt was lost to f/u - first repeat pap today.  9.      Family planning - pt interested in IUD so initially I advised her to sched appt w/ my partner Dr. Nolon Rod but now will defer to Dr. Talbert Nan as it will depend on results of cervical mass eval/bx and poss treatment. Therefore, start OCPs today. UNFORUTNATELY, DID NOT OBTAIN UPT TODAY - WILL ASK GYN TO PLEASE CHECK URINE PREGNANCY TEST AT PT'S GYN C/S APPT SINCE PT started on OCPS today. 10.     Attention deficit disorder (ADD) without hyperactivity - now has 40hrs/wk desk job at [employer] which  is what she wanted but long-standing ADD sxs returning. While in school, she was well controlled on Concerta 18mg  so will resume. If doing well, ok to request refill in a month - send in 2 additional rxs at that time. Recheck in OV in 2-3 mos to ensure doing well on new meds Concerta and OCPs and for additional stimulant refills. Reviewed will need to be seen q3 mos for Concerta med refills  Orders Placed This Encounter  Procedures  . Lipid panel    Order Specific Question:   Has the patient fasted?    Answer:   Yes  . HCV Ab w/Rflx to Verification  . HIV antibody  . RPR  . Interpretation:  . Ambulatory referral to Gynecology    Referral Priority:   Urgent    Referral Type:   Consultation    Referral Reason:   Specialty Services Required    Requested Specialty:   Gynecology    Number of Visits Requested:   1  . POCT urinalysis dipstick    Meds ordered this encounter  Medications  . FLUoxetine (PROZAC) 20 MG tablet    Sig: Take 1 tablet (20 mg total) by mouth daily.    Dispense:  90 tablet    Refill:  3  . norethindrone-ethinyl estradiol (JUNEL FE,GILDESS FE,LOESTRIN FE) 1-20 MG-MCG tablet    Sig: Take 1 tablet by mouth daily.    Dispense:  1 Package    Refill:  11  . methylphenidate 18 MG PO CR tablet    Sig: Take 1 tablet (18 mg total) by mouth daily.    Dispense:  30 tablet    Refill:  0    I personally performed the services described in this documentation, which was scribed in my presence. The recorded information has been reviewed and considered, and addended by me as needed.   [name], M.D.  Primary Care at [facility] [address] [place], [place] [address] [phone] phone [phone] fax  05/07/18 1:38 AM

## 2018-05-06 LAB — RPR: RPR: NONREACTIVE

## 2018-05-06 LAB — LIPID PANEL
CHOL/HDL RATIO: 2.4 ratio (ref 0.0–4.4)
Cholesterol, Total: 147 mg/dL (ref 100–199)
HDL: 61 mg/dL (ref >39)
LDL CALC: 74 mg/dL (ref 0–99)
Triglycerides: 61 mg/dL (ref 0–149)
VLDL CHOLESTEROL CAL: 12 mg/dL (ref 5–40)

## 2018-05-06 LAB — HCV AB W/RFLX TO VERIFICATION

## 2018-05-06 LAB — HIV ANTIBODY (ROUTINE TESTING W REFLEX): HIV Screen 4th Generation wRfx: NONREACTIVE

## 2018-05-06 LAB — HCV INTERPRETATION

## 2018-05-06 NOTE — Telephone Encounter (Signed)
Called and left a message for patient to call back to schedule this "urgent" new patient doctor referral appointment with our office to see any provider for: Cervical mass.

## 2018-05-08 ENCOUNTER — Other Ambulatory Visit: Payer: Self-pay

## 2018-05-08 ENCOUNTER — Encounter: Payer: Self-pay | Admitting: Obstetrics and Gynecology

## 2018-05-08 ENCOUNTER — Ambulatory Visit: Payer: 59 | Admitting: Obstetrics and Gynecology

## 2018-05-08 VITALS — BP 112/62 | HR 76 | Resp 20 | Ht 63.0 in | Wt 139.6 lb

## 2018-05-08 DIAGNOSIS — Z3009 Encounter for other general counseling and advice on contraception: Secondary | ICD-10-CM | POA: Diagnosis not present

## 2018-05-08 DIAGNOSIS — N888 Other specified noninflammatory disorders of cervix uteri: Secondary | ICD-10-CM | POA: Diagnosis not present

## 2018-05-08 DIAGNOSIS — N926 Irregular menstruation, unspecified: Secondary | ICD-10-CM

## 2018-05-08 LAB — PAP IG, CT-NG, RFX HPV ASCU
Chlamydia, Nuc. Acid Amp: NEGATIVE
GONOCOCCUS BY NUCLEIC ACID AMP: NEGATIVE
PAP SMEAR COMMENT: 0

## 2018-05-08 LAB — POCT URINE PREGNANCY: Preg Test, Ur: NEGATIVE

## 2018-05-08 NOTE — Patient Instructions (Signed)
Uterine Fibroids Uterine fibroids are tissue masses (tumors) that can develop in the womb (uterus). They are also called leiomyomas. This type of tumor is not cancerous (benign) and does not spread to other parts of the body outside of the pelvic area, which is between the hip bones. Occasionally, fibroids may develop in the fallopian tubes, in the cervix, or on the support structures (ligaments) that surround the uterus. You can have one or many fibroids. Fibroids can vary in size, weight, and where they grow in the uterus. Some can become quite large. Most fibroids do not require medical treatment. What are the causes? A fibroid can develop when a single uterine cell keeps growing (replicating). Most cells in the human body have a control mechanism that keeps them from replicating without control. What are the signs or symptoms? Symptoms may include:  Heavy bleeding during your period.  Bleeding or spotting between periods.  Pelvic pain and pressure.  Bladder problems, such as needing to urinate more often (urinary frequency) or urgently.  Inability to reproduce offspring (infertility).  Miscarriages.  How is this diagnosed? Uterine fibroids are diagnosed through a physical exam. Your health care provider may feel the lumpy tumors during a pelvic exam. Ultrasonography and an MRI may be done to determine the size, location, and number of fibroids. How is this treated? Treatment may include:  Watchful waiting. This involves getting the fibroid checked by your health care provider to see if it grows or shrinks. Follow your health care provider's recommendations for how often to have this checked.  Hormone medicines. These can be taken by mouth or given through an intrauterine device (IUD).  Surgery. ? Removing the fibroids (myomectomy) or the uterus (hysterectomy). ? Removing blood supply to the fibroids (uterine artery embolization).  If fibroids interfere with your fertility and you  want to become pregnant, your health care provider may recommend having the fibroids removed. Follow these instructions at home:  Keep all follow-up visits as directed by your health care provider. This is important.  Take over-the-counter and prescription medicines only as told by your health care provider. ? If you were prescribed a hormone treatment, take the hormone medicines exactly as directed.  Ask your health care provider about taking iron pills and increasing the amount of dark green, leafy vegetables in your diet. These actions can help to boost your blood iron levels, which may be affected by heavy menstrual bleeding.  Pay close attention to your period and tell your health care provider about any changes, such as: ? Increased blood flow that requires you to use more pads or tampons than usual per month. ? A change in the number of days that your period lasts per month. ? A change in symptoms that are associated with your period, such as abdominal cramping or back pain. Contact a health care provider if:  You have pelvic pain, back pain, or abdominal cramps that cannot be controlled with medicines.  You have an increase in bleeding between and during periods.  You soak tampons or pads in a half hour or less.  You feel lightheaded, extra tired, or weak. Get help right away if:  You faint.  You have a sudden increase in pelvic pain. This information is not intended to replace advice given to you by your health care provider. Make sure you discuss any questions you have with your health care provider. Document Released: 11/16/2000 Document Revised: 07/19/2016 Document Reviewed: 05/18/2014 Elsevier Interactive Patient Education  2018 [employer] Inc[employer]  

## 2018-05-08 NOTE — Progress Notes (Signed)
32 y.o. G0P0000 SingleCaucasianF here for a consultation by Dr Brigitte Pulse for a cervical mass.  The patient was seen for her routine annual exam on 05/05/18 and was noted to have a 1 cm friable mass protruding from her cervical os. She has a h/o LSIL pap in 2013 and didn't have f/u until this month. Pap from this week was negative, with negative HPV.  Period Cycle (Days): 30 Period Duration (Days): 5-7 days Period Pattern: Regular Menstrual Flow: Moderate Menstrual Control: Tampon Menstrual Control Change Freq (Hours): every 2 hours on heaviest day Dysmenorrhea: None  She can saturate a super tampon in up to 2 hours She started OCP's 2 days ago. Her cycle is a little light, but on time. She took plan b 1-2 weeks ago (within 2 hours of a broken condom). She just started OCP's, but is interested in IUD's. She requests a breast exam today.  Patient's last menstrual period was 05/06/2018 (exact date).          Sexually active: Yes.    The current method of family planning is OCP (estrogen/progesterone).    Exercising: Yes.    running and yoga Smoker:  no  Health Maintenance: Pap: 05-05-18 Neg; 2013 LGSIL History of abnormal Pap:  Yes, 2013 LGSIL--pt. Never followed up MMG:  n/a Colonoscopy:  n/a BMD:   n/a TDaP:  2008 Gardasil: completed   reports that she has never smoked. She has never used smokeless tobacco. She reports that she drinks about 3.0 oz of alcohol per week. She reports that she does not use drugs.  Past Medical History:  Diagnosis Date  . ADD (attention deficit disorder)   . Allergy   . Anemia   . Anxiety   . Asthma   . Depression   . Seasonal allergies     Past Surgical History:  Procedure Laterality Date  . RECONSTRUCTION MANDIBLE / MAXILLA     underbite correction    Current Outpatient Medications  Medication Sig Dispense Refill  . FLUoxetine (PROZAC) 20 MG tablet Take 1 tablet (20 mg total) by mouth daily. 90 tablet 3  . methylphenidate 18 MG PO CR tablet Take 1  tablet (18 mg total) by mouth daily. 30 tablet 0  . norethindrone-ethinyl estradiol (JUNEL FE,GILDESS FE,LOESTRIN FE) 1-20 MG-MCG tablet Take 1 tablet by mouth daily. 1 Package 11   No current facility-administered medications for this visit.     Family History  Problem Relation Age of Onset  . Cancer Father        prostate  . Heart disease Maternal Grandmother   . Hyperlipidemia Maternal Grandmother   . Stroke Maternal Grandmother   . Hypertension Maternal Grandmother     Review of Systems  Constitutional: Negative.   HENT: Negative.   Eyes: Negative.   Respiratory: Negative.   Cardiovascular: Negative.   Gastrointestinal: Negative.   Endocrine: Negative.   Genitourinary: Negative.   Musculoskeletal: Negative.   Skin: Negative.   Allergic/Immunologic: Negative.   Neurological: Negative.   Psychiatric/Behavioral: Negative.     Exam:   BP 112/62 (BP Location: Right Arm, Patient Position: Sitting, Cuff Size: Normal)   Pulse 76   Resp 20   Ht 5\' 3"  (1.6 m)   Wt 139 lb 9.6 oz (63.3 kg)   LMP 05/06/2018 (Exact Date)   BMI 24.73 kg/m   Weight change: @WEIGHTCHANGE @ Height:   Height: 5\' 3"  (160 cm)  Ht Readings from Last 3 Encounters:  05/08/18 5\' 3"  (1.6 m)  05/05/18 5\' 3"  (  1.6 m)  03/03/18 5\' 3"  (1.6 m)    General appearance: alert, cooperative and appears stated age Head: Normocephalic, without obvious abnormality, atraumatic Neck: no adenopathy, supple, symmetrical, trachea midline and thyroid normal to inspection and palpation Breasts: normal appearance, no masses or tenderness Abdomen: soft, non-tender; non distended,  no masses,  no organomegaly Extremities: extremities normal, atraumatic, no cyanosis or edema Skin: Skin color, texture, turgor normal. No rashes or lesions Lymph nodes: axillary nodes normal. Neurologic: Grossly normal   Pelvic: External genitalia:  no lesions              Urethra:  normal appearing urethra with no masses, tenderness or  lesions              Bartholins and Skenes: normal                 Vagina: normal appearing vagina with normal color and discharge, no lesions              Cervix: there is an approximately 1 cm mass just inside the cervix, feels firm. Suspect possible myoma.                Bimanual Exam:  Uterus:  normal size, contour, position, consistency, mobility, non-tender and anteverted              Adnexa: no mass, fullness, tenderness               Rectovaginal: Confirms               Anus:  normal sphincter tone, no lesions  Chaperone was present for exam.  A:  Cervical mass inside the cervical canal  Contraception counseling. Discussed options for IUD's, information given   Heavy cycles with low ferritin, not anemic  H/O LSIL pap, normal pap earlier this week with primary MD  P:   UPT negative  Return for an ultrasound to better delineate the cervical mass, suspect myoma, could be a nabothian cyst, feels to firm to be a polyp.  Information given and reviewed on the paragard, skyla, kyleena and mirena IUD's  Risks of insertion and side effects of IUD's reviewed  Discussed that the paragard would likely increase her menstrual flow, if she chooses a paragard I would recommend rechecking her CBC and Ferritin levels after a few cycles  I would not recommend IUD insertion until the cervical mass is further evaluated   CC: Dr Brigitte Pulse Note sent

## 2018-05-14 ENCOUNTER — Telehealth: Payer: Self-pay | Admitting: Obstetrics and Gynecology

## 2018-05-14 ENCOUNTER — Telehealth: Payer: Self-pay | Admitting: *Deleted

## 2018-05-14 NOTE — Telephone Encounter (Signed)
Call placed to patient review benefits for a recommended ultrasound. Left voicemail message requesting a return call.

## 2018-05-14 NOTE — Telephone Encounter (Signed)
DETAILED MESSAGE LEFT  PRESCRIPTION IS APPROVED FOR [facility]

## 2018-05-15 ENCOUNTER — Other Ambulatory Visit: Payer: Self-pay | Admitting: *Deleted

## 2018-05-15 DIAGNOSIS — N888 Other specified noninflammatory disorders of cervix uteri: Secondary | ICD-10-CM

## 2018-05-27 ENCOUNTER — Encounter: Payer: Self-pay | Admitting: Obstetrics and Gynecology

## 2018-05-27 ENCOUNTER — Other Ambulatory Visit: Payer: Self-pay

## 2018-05-27 ENCOUNTER — Ambulatory Visit (INDEPENDENT_AMBULATORY_CARE_PROVIDER_SITE_OTHER): Payer: 59

## 2018-05-27 ENCOUNTER — Ambulatory Visit (INDEPENDENT_AMBULATORY_CARE_PROVIDER_SITE_OTHER): Payer: 59 | Admitting: Obstetrics and Gynecology

## 2018-05-27 VITALS — BP 118/70 | HR 60 | Resp 14 | Wt 140.0 lb

## 2018-05-27 DIAGNOSIS — N888 Other specified noninflammatory disorders of cervix uteri: Secondary | ICD-10-CM

## 2018-05-27 DIAGNOSIS — D259 Leiomyoma of uterus, unspecified: Secondary | ICD-10-CM | POA: Diagnosis not present

## 2018-05-27 NOTE — Progress Notes (Signed)
GYNECOLOGY  VISIT   HPI: 32 y.o.   [name]   G0P0000 with Patient's last menstrual period was 05/06/2018 (exact date).   here for follow up cervical mass. She has heavy cycles, but no cramps and no postcoital bleeding.      GYNECOLOGIC HISTORY: Patient's last menstrual period was 05/06/2018 (exact date). Contraception:OCP  Menopausal hormone therapy: none        OB History    Gravida  0   Para  0   Term  0   Preterm  0   AB  0   Living  0     SAB  0   TAB  0   Ectopic  0   Multiple  0   Live Births                 Patient Active Problem List   Diagnosis Date Noted  . Iron deficiency anemia 09/01/2012  . Exercise-induced asthma 09/01/2012  . Depression   . ADD (attention deficit disorder)     Past Medical History:  Diagnosis Date  . ADD (attention deficit disorder)   . Allergy   . Anemia   . Anxiety   . Asthma   . Depression   . Seasonal allergies     Past Surgical History:  Procedure Laterality Date  . RECONSTRUCTION MANDIBLE / MAXILLA     underbite correction    Current Outpatient Medications  Medication Sig Dispense Refill  . FLUoxetine (PROZAC) 20 MG tablet Take 1 tablet (20 mg total) by mouth daily. 90 tablet 3  . methylphenidate 18 MG PO CR tablet Take 1 tablet (18 mg total) by mouth daily. 30 tablet 0  . norethindrone-ethinyl estradiol (JUNEL FE,GILDESS FE,LOESTRIN FE) 1-20 MG-MCG tablet Take 1 tablet by mouth daily. 1 Package 11   No current facility-administered medications for this visit.      ALLERGIES: Patient has no known allergies.  Family History  Problem Relation Age of Onset  . Cancer Father        prostate  . Heart disease Maternal Grandmother   . Hyperlipidemia Maternal Grandmother   . Stroke Maternal Grandmother   . Hypertension Maternal Grandmother     Social History   Socioeconomic History  . Marital status: Single    Spouse name: n/a  . Number of children: 0  . Years of education: 15+   . Highest education level: Not on file  Occupational History  . 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 Financial resource strain: Not on file  . Food insecurity:    Worry: Not on file    Inability: Not on file  . Transportation needs:    Medical: Not on file    Non-medical: Not on file  Tobacco Use  . Smoking status: Never Smoker  . Smokeless tobacco: Never Used  Substance and Sexual Activity  . Alcohol use: Yes    Alcohol/week: 3.0 oz    Types: 5 Cans of beer per week  . Drug use: No  . Sexual activity: Yes    Partners: Male    Birth control/protection: Condom, Pill  Lifestyle  . Physical activity:    Days per week: Not on file    Minutes per session: Not on file  . Stress: Not on file  Relationships  . Social connections:    Talks on phone: Not on file    Gets  together: Not on file    Attends religious service: Not on file    Active member of club or organization: Not on file    Attends meetings of clubs or organizations: Not on file    Relationship status: Not on file  . Intimate partner violence:    Fear of current or ex partner: Not on file    Emotionally abused: Not on file    Physically abused: Not on file    Forced sexual activity: Not on file  Other Topics Concern  . Not on file  Social History Narrative   Lives with 2 sisters (and soon-to-be brother-in-law) in the house next-door to their parents.    Review of Systems  Constitutional: Negative.   HENT: Negative.   Eyes: Negative.   Respiratory: Negative.   Cardiovascular: Negative.   Gastrointestinal: Negative.   Genitourinary: Negative.   Musculoskeletal: Negative.   Skin: Negative.   Neurological: Negative.   Endo/Heme/Allergies: Negative.   Psychiatric/Behavioral: Negative.     PHYSICAL EXAMINATION:    BP 118/70 (BP Location: Right Arm, Patient Position: Sitting, Cuff Size: Normal)   Pulse 60   Resp 14   Wt 140 lb  (63.5 kg)   LMP 05/06/2018 (Exact Date)   BMI 24.80 kg/m     General appearance: alert, cooperative and appears stated age Neck: no adenopathy, supple, symmetrical, trachea midline and thyroid normal to inspection and palpation Heart: regular rate and rhythm Lungs: CTAB Abdomen: soft, non-tender; bowel sounds normal; no masses,  no organomegaly Extremities: normal, atraumatic, no cyanosis Skin: normal color, texture and turgor, no rashes or lesions Lymph: normal cervical supraclavicular and inguinal nodes Neurologic: grossly normal   ASSESSMENT Intracervical myoma, 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 Discussed the option of doing nothing or removal. The concern of doing nothing is it enlarging and more difficult or emergent removal. Risk of prolapse.  Currently I don't think she could have a IUD insertion (as she was considering)    PLAN Discussed cervical myomectomy. Appears to have a stalk superiorly. Would attempt hysteroscopic resection of the base. Discussed risk of bleeding.     An After Visit Summary was printed and given to the patient.  ~15 minutes face to face time of which over 50% was spent in counseling.

## 2018-05-28 ENCOUNTER — Other Ambulatory Visit: Payer: Self-pay

## 2018-05-28 ENCOUNTER — Encounter: Payer: Self-pay | Admitting: Obstetrics and Gynecology

## 2018-05-28 ENCOUNTER — Encounter: Payer: Self-pay | Admitting: Physician Assistant

## 2018-05-28 ENCOUNTER — Ambulatory Visit: Payer: 59 | Admitting: Physician Assistant

## 2018-05-28 VITALS — BP 114/60 | HR 86 | Temp 98.6°F | Resp 18 | Ht 63.39 in | Wt 138.4 lb

## 2018-05-28 DIAGNOSIS — J0100 Acute maxillary sinusitis, unspecified: Secondary | ICD-10-CM | POA: Diagnosis not present

## 2018-05-28 MED ORDER — AMOXICILLIN 875 MG PO TABS: 875.0000 mg | ORAL_TABLET | Freq: Two times a day (BID) | ORAL | 0 refills | Status: AC

## 2018-05-28 NOTE — Progress Notes (Signed)
MRN: [id number] DOB: 25-Oct-1986  Subjective:   Christy Alvarado is a 32 y.o. female presenting for chief complaint of Sinus Problem (X 1 1/2 weeks) and Nasal Congestion .  Reports ~10 day history of illness.  Started out with nasal he has a normal sinus congestion, ear fullness.  Was using nasal saline rinses and blowing greenish mucus out.  However now it feels like it is stuck and she is not getting much out.  Feels sinus pressure.  Having postnasal drip causing her to cough. Has tried nasal saline rinses and sudafed with no full relief. Denies fever, sore throat, wheezing, shortness of breath, chest pain and myalgia, chills, nausea, vomiting, abdominal pain and diarrhea. Has not had sick contact with anyone. Has history of seasonal allergies, takes oral antihistamine.  History of asthma as a child. Patient has had flu shot this season.  History of sinus infections in the past.  Denies smoking.  Denies any other aggravating or relieving factors, no other questions or concerns.  Christy Alvarado has a current medication list which includes the following prescription(s): fluoxetine, methylphenidate, and norethindrone-ethinyl estradiol. Also has No Known Allergies.  Christy Alvarado  has a past medical history of ADD (attention deficit disorder), Allergy, Anemia, Anxiety, Asthma, Depression, and Seasonal allergies. Also  has a past surgical history that includes Reconstruction mandible / maxilla.   Objective:   Vitals: BP 114/60 (BP Location: Right Arm, Patient Position: Sitting, Cuff Size: Normal)   Pulse 86   Temp 98.6 F (37 C) (Oral)   Resp 18   Ht 5' 3.39" (1.61 m)   Wt 138 lb 6.4 oz (62.8 kg)   LMP 05/06/2018 (Exact Date)   SpO2 99%   BMI 24.22 kg/m   Physical Exam  Constitutional: She is oriented to person, place, and time. She appears well-developed and well-nourished. No distress.  HENT:  Head: Normocephalic and atraumatic.  Right Ear: Tympanic membrane, external ear and ear canal normal.    Left Ear: Tympanic membrane, external ear and ear canal normal.  Nose: Mucosal edema ( moderate bilaterally) present. Right sinus exhibits maxillary sinus tenderness (moderate TTP). Right sinus exhibits no frontal sinus tenderness. Left sinus exhibits maxillary sinus tenderness (moderate TTP). Left sinus exhibits no frontal sinus tenderness.  Mouth/Throat: Uvula is midline and mucous membranes are normal. No posterior oropharyngeal edema, posterior oropharyngeal erythema or tonsillar abscesses. No tonsillar exudate.  Eyes: Conjunctivae are normal.  Neck: Normal range of motion.  Cardiovascular: Normal rate, regular rhythm, normal heart sounds and intact distal pulses.  Pulmonary/Chest: Effort normal and breath sounds normal. She has no decreased breath sounds. She has no wheezes. She has no rhonchi. She has no rales.  Lymphadenopathy:       Head (right side): No submental, no submandibular, no tonsillar, no preauricular, no posterior auricular and no occipital adenopathy present.       Head (left side): No submental, no submandibular, no tonsillar, no preauricular, no posterior auricular and no occipital adenopathy present.    She has no cervical adenopathy.       Right: No supraclavicular adenopathy present.       Left: No supraclavicular adenopathy present.  Neurological: She is alert and oriented to person, place, and time.  Skin: Skin is warm and dry.  Psychiatric: She has a normal mood and affect.  Vitals reviewed.   No results found for this or any previous visit (from the past 24 hour(s)).  Assessment and Plan :  1. Acute maxillary sinusitis, recurrence  not specified Hx and PE findings consistent with sinusitis.  Due to duration, will treat for underlying bacterial etiology at this time.  Encourage patient to continue with symptom relief.  Advised to return to clinic if symptoms worsen, do not improve, or as needed.  - amoxicillin (AMOXIL) 875 MG tablet; Take 1 tablet (875 mg total)  by mouth 2 (two) times daily for 7 days.  Dispense: 14 tablet; Refill: 0  Tenna Delaine, PA-C  [facility] 05/28/2018 2:27 PM

## 2018-05-28 NOTE — Patient Instructions (Addendum)
Please take antibiotic as prescribed.  Continue with symptomatic treatment.  I recommend continuing nasal saline rinses and oral antihistamine.  You may continue use Sudafed as needed.  Please return to clinic if symptoms worsen, do not improve, or as needed. Thank you for letting me participate in your health and well being.  Tea recipe for cough : boil water, add 2 inches shaved ginger root, steep 15 minutes, add juice from 2 full lemons, and 2 tbsp honey.       Sinusitis, Adult Sinusitis is soreness and inflammation of your sinuses. Sinuses are hollow spaces in the bones around your face. They are located:  Around your eyes.  In the middle of your forehead.  Behind your nose.  In your cheekbones.  Your sinuses and nasal passages are lined with a stringy fluid (mucus). Mucus normally drains out of your sinuses. When your nasal tissues get inflamed or swollen, the mucus can get trapped or blocked so air cannot flow through your sinuses. This lets bacteria, viruses, and funguses grow, and that leads to infection. Follow these instructions at home: Medicines  Take, use, or apply over-the-counter and prescription medicines only as told by your doctor. These may include nasal sprays.  If you were prescribed an antibiotic medicine, take it as told by your doctor. Do not stop taking the antibiotic even if you start to feel better. Hydrate and Humidify  Drink enough water to keep your pee (urine) clear or pale yellow.  Use a cool mist humidifier to keep the humidity level in your home above 50%.  Breathe in steam for 10-15 minutes, 3-4 times a day or as told by your doctor. You can do this in the bathroom while a hot shower is running.  Try not to spend time in cool or dry air. Rest  Rest as much as possible.  Sleep with your head raised (elevated).  Make sure to get enough sleep each night. General instructions  Put a warm, moist washcloth on your face 3-4 times a day or as  told by your doctor. This will help with discomfort.  Wash your hands often with soap and water. If there is no soap and water, use hand sanitizer.  Do not smoke. Avoid being around people who are smoking (secondhand smoke).  Keep all follow-up visits as told by your doctor. This is important. Contact a doctor if:  You have a fever.  Your symptoms get worse.  Your symptoms do not get better within 10 days. Get help right away if:  You have a very bad headache.  You cannot stop throwing up (vomiting).  You have pain or swelling around your face or eyes.  You have trouble seeing.  You feel confused.  Your neck is stiff.  You have trouble breathing. This information is not intended to replace advice given to you by your health care provider. Make sure you discuss any questions you have with your health care provider. Document Released: 05/07/2008 Document Revised: 07/15/2016 Document Reviewed: 09/14/2015 Elsevier Interactive Patient Education  2018 [employer].  IF you received an x-ray today, you will receive an invoice from [facility]. Please contact [facility] at [phone] with questions or concerns regarding your invoice.   IF you received labwork today, you will receive an invoice from [facility]. Please contact [facility] at [phone] with questions or concerns regarding your invoice.   Our billing staff will not be able to assist you with questions regarding bills from these companies.  You will  be contacted with the lab results as soon as they are available. The fastest way to get your results is to activate your My Chart account. Instructions are located on the last page of this paperwork. If you have not heard from [place] regarding the results in 2 weeks, please contact this office.

## 2018-05-29 ENCOUNTER — Telehealth: Payer: Self-pay | Admitting: Obstetrics and Gynecology

## 2018-05-29 NOTE — Telephone Encounter (Signed)
Ultrasound completed on 05/27/18. Ok to close encounter

## 2018-05-29 NOTE — Telephone Encounter (Signed)
Spoke with patient regarding benefit for surgery. Patient understood and agreeable. Patient aware this is professional benefit only. Patient aware will be contacted by hospital for separate benefits. Patient requested a number to contact to the hospital to obtain facility benefit information. Patient provided phone number to the [facility] [phone]. Patient to call back after speaking with the [facility]   cc: Lamont Snowball, RN

## 2018-06-02 ENCOUNTER — Encounter: Payer: Managed Care, Other (non HMO) | Admitting: Family Medicine

## 2018-06-11 NOTE — Telephone Encounter (Signed)
Call placed to patient to follow up in regards to scheduling surgery. Patient advises she does not want to schedule at this time. Forwarding to Nurse Supervisor to review with provider.  Routing to [place], Therapist, sports

## 2018-06-11 NOTE — Telephone Encounter (Signed)
Per office note, discussed option of monitoring. Please advise of any additional follow-up since patient declines to proceed.

## 2018-06-14 NOTE — Telephone Encounter (Signed)
I would recommend a 6 month follow up exam.

## 2018-06-19 NOTE — Telephone Encounter (Signed)
Call to patient. Advised of recommendation. Appointment to recheck polyp scheduled for 12-01-18 at 4pm.   Encounter closed.

## 2018-08-05 ENCOUNTER — Telehealth: Payer: Self-pay | Admitting: Family Medicine

## 2018-08-05 NOTE — Telephone Encounter (Signed)
Called. Pt. To inform them of Dr. Raul Del leave of absence. Pt. Canceled appt. With no desire to reschedule.

## 2018-08-07 ENCOUNTER — Telehealth: Payer: Self-pay | Admitting: Obstetrics and Gynecology

## 2018-08-07 NOTE — Telephone Encounter (Signed)
Spoke with patient. Patient states she has been on Junel Fe for 4 months. Patient reports irregular bleeding since starting OCP. Flow is not heavy, changes regular tampon 2x/day. Bleeds for approximate 3 wks out of the month. SA, stops intercourse due to bleeding. Denies pain, N/V, fever/chills, lightheadedness, weakness, or fatigue.  PUS on 05/27/18, intracervical myoma, not symptomatic.  Advised will review with Dr. Talbert Nan and return call with recommendations, patient agreeable.   Dr. Talbert Nan -please advise.

## 2018-08-07 NOTE — Telephone Encounter (Signed)
Spoke with patient. Patient is taking OCP daily, no missed or late pills. OV scheduled for 08/11/18 at 9am with Dr. Talbert Nan.   Routing to provider for final review. Patient is agreeable to disposition. Will close encounter.

## 2018-08-07 NOTE — Telephone Encounter (Signed)
I would recommend that she come in for evaluation. I want to make sure the myoma isn't changing with the OCP's. Confirm that she is taking her pills at the same time daily with no missed pills.

## 2018-08-07 NOTE — Telephone Encounter (Signed)
Patient is asking to talk with a nurse about some side effects that she is having with her birth control.

## 2018-08-11 ENCOUNTER — Encounter: Payer: Self-pay | Admitting: Obstetrics and Gynecology

## 2018-08-11 ENCOUNTER — Ambulatory Visit: Payer: 59 | Admitting: Family Medicine

## 2018-08-11 ENCOUNTER — Ambulatory Visit: Payer: 59 | Admitting: Obstetrics and Gynecology

## 2018-08-11 ENCOUNTER — Encounter (HOSPITAL_BASED_OUTPATIENT_CLINIC_OR_DEPARTMENT_OTHER): Payer: Self-pay

## 2018-08-11 ENCOUNTER — Other Ambulatory Visit: Payer: Self-pay

## 2018-08-11 VITALS — BP 100/72 | HR 84 | Temp 98.5°F | Wt 150.0 lb

## 2018-08-11 DIAGNOSIS — N939 Abnormal uterine and vaginal bleeding, unspecified: Secondary | ICD-10-CM | POA: Diagnosis not present

## 2018-08-11 DIAGNOSIS — D259 Leiomyoma of uterus, unspecified: Secondary | ICD-10-CM

## 2018-08-11 LAB — POCT URINE PREGNANCY: Preg Test, Ur: NEGATIVE

## 2018-08-11 NOTE — Progress Notes (Signed)
GYNECOLOGY  VISIT   HPI: 32 y.o.   [name]   G0P0000 with No LMP recorded (lmp unknown).   here for irregular menses on OCP.   The patient has a cervical myoma.  She has been on OCP's for about 4 months. Prior to the pill she was having regular, heavy cycles.  Since starting the pill she is having been having spotting most of the time. She has a little more of a cycle the week she is supposed to be bleeding. Hasn't been heavy until the last week. She is currently in the middle of a pill pack.  She had sex last Wednesday, started having heavy vaginal bleeding and has been bleeding since thin. Initially she was saturating a regular tampon in a few hours.  Saturday night she bleed through 3 super tampons in an hour. Bleeding has slowed down again. Currently saturating a super tampon in an hour.  She is very tired, not light headed or dizzy. Slight cramping, some lower back pain.  GYNECOLOGIC HISTORY: No LMP recorded (lmp unknown). Contraception:OCP Menopausal hormone therapy: n/a        OB History    Gravida  0   Para  0   Term  0   Preterm  0   AB  0   Living  0     SAB  0   TAB  0   Ectopic  0   Multiple  0   Live Births                 Patient Active Problem List   Diagnosis Date Noted  . Iron deficiency anemia 09/01/2012  . Exercise-induced asthma 09/01/2012  . Depression   . ADD (attention deficit disorder)     Past Medical History:  Diagnosis Date  . ADD (attention deficit disorder)   . Allergy   . Anemia   . Anxiety   . Asthma   . Depression   . Seasonal allergies     Past Surgical History:  Procedure Laterality Date  . RECONSTRUCTION MANDIBLE / MAXILLA     underbite correction    Current Outpatient Medications  Medication Sig Dispense Refill  . FLUoxetine (PROZAC) 20 MG tablet Take 1 tablet (20 mg total) by mouth daily. 90 tablet 3  . methylphenidate 18 MG PO CR tablet Take 1 tablet (18 mg total) by mouth daily. 30  tablet 0  . norethindrone-ethinyl estradiol (JUNEL FE,GILDESS FE,LOESTRIN FE) 1-20 MG-MCG tablet Take 1 tablet by mouth daily. 1 Package 11   No current facility-administered medications for this visit.      ALLERGIES: Patient has no known allergies.  Family History  Problem Relation Age of Onset  . Cancer Father        prostate  . Heart disease Maternal Grandmother   . Hyperlipidemia Maternal Grandmother   . Stroke Maternal Grandmother   . Hypertension Maternal Grandmother     Social History   Socioeconomic History  . Marital status: Single    Spouse name: n/a  . Number of children: 0  . Years of education: 15+  . Highest education level: Not on file  Occupational History  . 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 Financial resource strain: Not on file  . Food insecurity:    Worry: Not on file    Inability: Not on file  . Transportation needs:  Medical: Not on file    Non-medical: Not on file  Tobacco Use  . Smoking status: Never Smoker  . Smokeless tobacco: Never Used  Substance and Sexual Activity  . Alcohol use: Yes    Alcohol/week: 5.0 standard drinks    Types: 5 Cans of beer per week  . Drug use: No  . Sexual activity: Yes    Partners: Male    Birth control/protection: Condom, Pill  Lifestyle  . Physical activity:    Days per week: Not on file    Minutes per session: Not on file  . Stress: Not on file  Relationships  . Social connections:    Talks on phone: Not on file    Gets together: Not on file    Attends religious service: Not on file    Active member of club or organization: Not on file    Attends meetings of clubs or organizations: Not on file    Relationship status: Not on file  . Intimate partner violence:    Fear of current or ex partner: Not on file    Emotionally abused: Not on file    Physically abused: Not on file    Forced sexual activity: Not on file  Other  Topics Concern  . Not on file  Social History Narrative   Lives with 2 sisters (and soon-to-be brother-in-law) in the house next-door to their parents.    Review of Systems  Constitutional: Negative.        Weight gain  HENT: Negative.   Eyes: Negative.   Respiratory: Negative.   Cardiovascular: Negative.   Gastrointestinal: Negative.        Blaoting  Genitourinary: Positive for frequency and urgency.       Irregular menses Loss of sexual interest Pain with intercourse  Musculoskeletal: Negative.   Skin: Negative.   Neurological: Negative.   Endo/Heme/Allergies: Negative.   Psychiatric/Behavioral: Negative.   All other systems reviewed and are negative.   PHYSICAL EXAMINATION:    BP 100/72   Pulse 84   Wt 150 lb (68 kg)   LMP  (LMP Unknown)   BMI 26.25 kg/m     General appearance: alert, cooperative and appears stated age Neck: no adenopathy, supple, symmetrical, trachea midline and thyroid normal to inspection and palpation  Heart: regular rate and rhythm Lungs: CTAB Abdomen: soft, non-tender; bowel sounds normal; no masses,  no organomegaly Extremities: normal, atraumatic, no cyanosis Skin: normal color, texture and turgor, no rashes or lesions Lymph: normal cervical supraclavicular and inguinal nodes Neurologic: grossly normal    Pelvic: External genitalia:  no lesions              Urethra:  normal appearing urethra with no masses, tenderness or lesions              Bartholins and Skenes: normal                 Vagina: normal appearing vagina with normal color and discharge, no lesions              Cervix: 2 cm prolapsed cervical fibroid, no sign of infection. Pedicle palpated. No cervical motion tenderness.               Bimanual Exam:  Uterus:  normal size, contour, position, consistency, mobility, non-tender              Adnexa: no mass, fullness, tenderness  Chaperone was present for exam.  ASSESSMENT Prolapsing cervical myoma Very heavy  bleeding over the weekend, currently not actively bleeding    PLAN UPT negative Hgb 11.6 Send CBC, Ferritin Plan cervical myomectomy tomorrow (patient stable, has eaten this am) Risks of bleeding, infection, need for further surgery reviewed with the patient and her mother.    An After Visit Summary was printed and given to the patient.  ~25 minutes face to face time of which over 50% was spent in counseling.

## 2018-08-11 NOTE — Progress Notes (Signed)
Spoke with:  Rossana NPO:  After Midnight, no gum, candy, or mints   Arrival time: 3491PH Labs: (CBC, UPT 08/11/2018 in epic) AM medications:  Fluoxetine Pre op orders: No Ride home:  Stanton Kidney (mom) [phone]

## 2018-08-12 ENCOUNTER — Ambulatory Visit (HOSPITAL_BASED_OUTPATIENT_CLINIC_OR_DEPARTMENT_OTHER)
Admission: RE | Admit: 2018-08-12 | Discharge: 2018-08-12 | Disposition: A | Discharge status: Home / Self Care | Payer: 59 | Source: Ambulatory Visit | Attending: Obstetrics and Gynecology | Admitting: Obstetrics and Gynecology

## 2018-08-12 ENCOUNTER — Ambulatory Visit (HOSPITAL_BASED_OUTPATIENT_CLINIC_OR_DEPARTMENT_OTHER): Payer: 59 | Admitting: Certified Registered"

## 2018-08-12 ENCOUNTER — Encounter (HOSPITAL_BASED_OUTPATIENT_CLINIC_OR_DEPARTMENT_OTHER): Payer: Self-pay | Admitting: *Deleted

## 2018-08-12 ENCOUNTER — Encounter (HOSPITAL_BASED_OUTPATIENT_CLINIC_OR_DEPARTMENT_OTHER)
Admission: RE | Disposition: A | Discharge status: Home / Self Care | Payer: Self-pay | Source: Ambulatory Visit | Attending: Obstetrics and Gynecology

## 2018-08-12 DIAGNOSIS — F419 Anxiety disorder, unspecified: Secondary | ICD-10-CM | POA: Diagnosis not present

## 2018-08-12 DIAGNOSIS — F329 Major depressive disorder, single episode, unspecified: Secondary | ICD-10-CM | POA: Insufficient documentation

## 2018-08-12 DIAGNOSIS — Z79899 Other long term (current) drug therapy: Secondary | ICD-10-CM | POA: Diagnosis not present

## 2018-08-12 DIAGNOSIS — D259 Leiomyoma of uterus, unspecified: Secondary | ICD-10-CM | POA: Diagnosis not present

## 2018-08-12 DIAGNOSIS — N939 Abnormal uterine and vaginal bleeding, unspecified: Secondary | ICD-10-CM | POA: Diagnosis not present

## 2018-08-12 DIAGNOSIS — Z793 Long term (current) use of hormonal contraceptives: Secondary | ICD-10-CM | POA: Diagnosis not present

## 2018-08-12 HISTORY — PX: PROLAPSED UTERINE FIBROID LIGATION: SHX5400

## 2018-08-12 HISTORY — DX: Other fatigue: R53.83

## 2018-08-12 HISTORY — DX: Plantar wart: B07.0

## 2018-08-12 HISTORY — DX: Syncope and collapse: R55

## 2018-08-12 HISTORY — PX: HYSTEROSCOPY W/D&C: SHX1775

## 2018-08-12 HISTORY — DX: Leiomyoma of uterus, unspecified: D25.9

## 2018-08-12 LAB — CBC
HEMATOCRIT: 38.5 % (ref 36.0–46.0)
HEMOGLOBIN: 12.1 g/dL (ref 11.1–15.9)
HEMOGLOBIN: 12.2 g/dL (ref 12.0–15.0)
Hematocrit: 39.4 % (ref 34.0–46.6)
MCH: 26.1 pg — ABNORMAL LOW (ref 26.6–33.0)
MCH: 26.6 pg (ref 26.0–34.0)
MCHC: 30.7 g/dL — ABNORMAL LOW (ref 31.5–35.7)
MCHC: 31.7 g/dL (ref 30.0–36.0)
MCV: 85 fL (ref 79–97)
MCV: 84.1 fL (ref 78.0–100.0)
Platelets: 218 10*3/uL (ref 150–450)
Platelets: 212 10*3/uL (ref 150–400)
RBC: 4.64 x10E6/uL (ref 3.77–5.28)
RBC: 4.58 MIL/uL (ref 3.87–5.11)
RDW: 15.9 % — AB (ref 12.3–15.4)
RDW: 15 % (ref 11.5–15.5)
WBC: 5.6 10*3/uL (ref 3.4–10.8)
WBC: 5.3 10*3/uL (ref 4.0–10.5)

## 2018-08-12 LAB — FERRITIN: Ferritin: 7 ng/mL — ABNORMAL LOW (ref 15–150)

## 2018-08-12 SURGERY — DILATATION AND CURETTAGE /HYSTEROSCOPY
Anesthesia: General | Site: Uterus

## 2018-08-12 MED ORDER — CEFAZOLIN SODIUM-DEXTROSE 2-4 GM/100ML-% IV SOLN
2.0000 g | INTRAVENOUS | Status: AC
  Administered 2018-08-12: 2 g via INTRAVENOUS
  Filled 2018-08-12: qty 100

## 2018-08-12 MED ORDER — DEXAMETHASONE SODIUM PHOSPHATE 10 MG/ML IJ SOLN
INTRAMUSCULAR | Status: DC | PRN
  Administered 2018-08-12: 10 mg via INTRAVENOUS

## 2018-08-12 MED ORDER — FENTANYL CITRATE (PF) 100 MCG/2ML IJ SOLN
INTRAMUSCULAR | Status: AC
  Filled 2018-08-12: qty 2

## 2018-08-12 MED ORDER — SCOPOLAMINE 1 MG/3DAYS TD PT72
MEDICATED_PATCH | TRANSDERMAL | Status: DC | PRN
  Administered 2018-08-12: 1 via TRANSDERMAL

## 2018-08-12 MED ORDER — LIDOCAINE 2% (20 MG/ML) 5 ML SYRINGE
INTRAMUSCULAR | Status: AC
  Filled 2018-08-12: qty 5

## 2018-08-12 MED ORDER — MIDAZOLAM HCL 2 MG/2ML IJ SOLN
INTRAMUSCULAR | Status: AC
  Filled 2018-08-12: qty 2

## 2018-08-12 MED ORDER — CEFAZOLIN SODIUM-DEXTROSE 2-4 GM/100ML-% IV SOLN
INTRAVENOUS | Status: AC
  Filled 2018-08-12: qty 100

## 2018-08-12 MED ORDER — FENTANYL CITRATE (PF) 100 MCG/2ML IJ SOLN
25.0000 ug | INTRAMUSCULAR | Status: DC | PRN
  Administered 2018-08-12: 50 ug via INTRAVENOUS
  Filled 2018-08-12: qty 1

## 2018-08-12 MED ORDER — PROPOFOL 10 MG/ML IV BOLUS
INTRAVENOUS | Status: DC | PRN
  Administered 2018-08-12: 170 mg via INTRAVENOUS
  Administered 2018-08-12: 30 mg via INTRAVENOUS

## 2018-08-12 MED ORDER — METOCLOPRAMIDE HCL 5 MG/ML IJ SOLN
10.0000 mg | Freq: Once | INTRAMUSCULAR | Status: DC | PRN
  Filled 2018-08-12: qty 2

## 2018-08-12 MED ORDER — KETOROLAC TROMETHAMINE 30 MG/ML IJ SOLN
INTRAMUSCULAR | Status: AC
  Filled 2018-08-12: qty 1

## 2018-08-12 MED ORDER — DEXAMETHASONE SODIUM PHOSPHATE 10 MG/ML IJ SOLN
INTRAMUSCULAR | Status: AC
  Filled 2018-08-12: qty 1

## 2018-08-12 MED ORDER — ONDANSETRON HCL 4 MG/2ML IJ SOLN
INTRAMUSCULAR | Status: DC | PRN
  Administered 2018-08-12: 4 mg via INTRAVENOUS

## 2018-08-12 MED ORDER — KETOROLAC TROMETHAMINE 30 MG/ML IJ SOLN
INTRAMUSCULAR | Status: DC | PRN
  Administered 2018-08-12: 30 mg via INTRAVENOUS

## 2018-08-12 MED ORDER — LACTATED RINGERS IV SOLN
INTRAVENOUS | Status: DC
  Filled 2018-08-12: qty 1000

## 2018-08-12 MED ORDER — MIDAZOLAM HCL 5 MG/5ML IJ SOLN
INTRAMUSCULAR | Status: DC | PRN
  Administered 2018-08-12: 2 mg via INTRAVENOUS

## 2018-08-12 MED ORDER — LIDOCAINE 2% (20 MG/ML) 5 ML SYRINGE
INTRAMUSCULAR | Status: DC | PRN
  Administered 2018-08-12: 80 mg via INTRAVENOUS

## 2018-08-12 MED ORDER — VASOPRESSIN 20 UNIT/ML IV SOLN
INTRAVENOUS | Status: DC | PRN
  Administered 2018-08-12: 13 mL via INTRAMUSCULAR

## 2018-08-12 MED ORDER — FENTANYL CITRATE (PF) 100 MCG/2ML IJ SOLN
INTRAMUSCULAR | Status: DC | PRN
  Administered 2018-08-12 (×2): 50 ug via INTRAVENOUS

## 2018-08-12 MED ORDER — ONDANSETRON HCL 4 MG/2ML IJ SOLN
INTRAMUSCULAR | Status: AC
  Filled 2018-08-12: qty 2

## 2018-08-12 MED ORDER — MEPERIDINE HCL 25 MG/ML IJ SOLN
6.2500 mg | INTRAMUSCULAR | Status: DC | PRN
  Filled 2018-08-12: qty 1

## 2018-08-12 MED ORDER — LACTATED RINGERS IV SOLN
INTRAVENOUS | Status: DC
  Administered 2018-08-12: 09:00:00 via INTRAVENOUS
  Filled 2018-08-12: qty 1000

## 2018-08-12 MED ORDER — SUGAMMADEX SODIUM 200 MG/2ML IV SOLN
INTRAVENOUS | Status: AC
  Filled 2018-08-12: qty 2

## 2018-08-12 MED ORDER — ONDANSETRON HCL 4 MG/2ML IJ SOLN
INTRAMUSCULAR | Status: AC
  Filled 2018-08-12: qty 4

## 2018-08-12 MED ORDER — SODIUM CHLORIDE 0.9 % IR SOLN
Status: DC | PRN
  Administered 2018-08-12: 3000 mL

## 2018-08-12 MED ORDER — PROPOFOL 10 MG/ML IV BOLUS
INTRAVENOUS | Status: AC
  Filled 2018-08-12: qty 20

## 2018-08-12 SURGICAL SUPPLY — 23 items
CANISTER SUCT 3000ML PPV (MISCELLANEOUS) ×6 IMPLANT
CATH ROBINSON RED A/P 16FR (CATHETERS) IMPLANT
DEVICE MYOSURE LITE (MISCELLANEOUS) IMPLANT
DEVICE MYOSURE REACH (MISCELLANEOUS) ×3 IMPLANT
DILATOR CANAL MILEX (MISCELLANEOUS) IMPLANT
ENDOLOOP SUT PDS II  0 18 (SUTURE) ×4
ENDOLOOP SUT PDS II 0 18 (SUTURE) ×2 IMPLANT
GAUZE SPONGE 4X4 16PLY XRAY LF (GAUZE/BANDAGES/DRESSINGS) ×3 IMPLANT
GLOVE BIO SURGEON STRL SZ 6.5 (GLOVE) ×4 IMPLANT
GLOVE BIO SURGEONS STRL SZ 6.5 (GLOVE) ×2
GOWN STRL REUS W/TWL LRG LVL3 (GOWN DISPOSABLE) ×3 IMPLANT
IV NS IRRIG 3000ML ARTHROMATIC (IV SOLUTION) ×3 IMPLANT
KIT PROCEDURE FLUENT (KITS) ×3 IMPLANT
KIT TURNOVER CYSTO (KITS) ×3 IMPLANT
MYOSURE XL FIBROID REM (MISCELLANEOUS)
PACK VAGINAL MINOR WOMEN LF (CUSTOM PROCEDURE TRAY) ×3 IMPLANT
PAD OB MATERNITY 4.3X12.25 (PERSONAL CARE ITEMS) ×3 IMPLANT
PAD PREP 24X48 CUFFED NSTRL (MISCELLANEOUS) ×3 IMPLANT
SEAL ROD LENS SCOPE MYOSURE (ABLATOR) ×3 IMPLANT
SYR 20CC LL (SYRINGE) IMPLANT
SYSTEM TISS REMOVAL MYSR XL RM (MISCELLANEOUS) IMPLANT
TOWEL OR 17X24 6PK STRL BLUE (TOWEL DISPOSABLE) ×6 IMPLANT
WATER STERILE IRR 500ML POUR (IV SOLUTION) IMPLANT

## 2018-08-12 NOTE — Transfer of Care (Signed)
Immediate Anesthesia Transfer of Care Note  Patient: Christy Alvarado  Procedure(s) Performed: DILATATION AND CURETTAGE /HYSTEROSCOPY (N/A ) MYOMECTOMY CERVICAL (N/A )  Patient Location: PACU  Anesthesia Type:General  Level of Consciousness: drowsy  Airway & Oxygen Therapy: Patient Spontanous Breathing and Patient connected to nasal cannula oxygen  Post-op Assessment: Report given to RN and Post -op Vital signs reviewed and stable  Post vital signs: Reviewed and stable  Last Vitals:  Vitals Value Taken Time  BP 128/87 08/12/2018 10:45 AM  Temp    Pulse 66 08/12/2018 10:47 AM  Resp 15 08/12/2018 10:47 AM  SpO2 96 % 08/12/2018 10:47 AM  Vitals shown include unvalidated device data.  Last Pain:  Vitals:   08/12/18 0831  TempSrc: Oral         Complications: No apparent anesthesia complications

## 2018-08-12 NOTE — Anesthesia Procedure Notes (Signed)
Procedure Name: LMA Insertion Date/Time: 08/12/2018 10:04 AM Performed by: Gwyndolyn Saxon, CRNA Pre-anesthesia Checklist: Patient identified, Emergency Drugs available, Suction available and Patient being monitored Patient Re-evaluated:Patient Re-evaluated prior to induction Oxygen Delivery Method: Circle system utilized Preoxygenation: Pre-oxygenation with 100% oxygen Induction Type: IV induction Ventilation: Mask ventilation without difficulty LMA: LMA inserted LMA Size: 4.0 Number of attempts: 1 Placement Confirmation: positive ETCO2,  CO2 detector and breath sounds checked- equal and bilateral Tube secured with: Tape Dental Injury: Teeth and Oropharynx as per pre-operative assessment

## 2018-08-12 NOTE — Discharge Instructions (Signed)
Take iron supplement for a month to restore iron levels.   Post Anesthesia Home Care Instructions  Activity: Get plenty of rest for the remainder of the day. A responsible individual must stay with you for 24 hours following the procedure.  For the next 24 hours, DO NOT: -Drive a car -Paediatric nurse -Drink alcoholic beverages -Take any medication unless instructed by your physician -Make any legal decisions or sign important papers.  Meals: Start with liquid foods such as gelatin or soup. Progress to regular foods as tolerated. Avoid greasy, spicy, heavy foods. If nausea and/or vomiting occur, drink only clear liquids until the nausea and/or vomiting subsides. Call your physician if vomiting continues.  Special Instructions/Symptoms: Your throat may feel dry or sore from the anesthesia or the breathing tube placed in your throat during surgery. If this causes discomfort, gargle with warm salt water. The discomfort should disappear within 24 hours.  If you had a scopolamine patch placed behind your ear for the management of post- operative nausea and/or vomiting:  1. The medication in the patch is effective for 72 hours, after which it should be removed.  Wrap patch in a tissue and discard in the trash. Wash hands thoroughly with soap and water. 2. You may remove the patch earlier than 72 hours if you experience unpleasant side effects which may include dry mouth, dizziness or visual disturbances. 3. Avoid touching the patch. Wash your hands with soap and water after contact with the patch.     D & C Home care Instructions:   Personal hygiene:  Used sanitary napkins for vaginal drainage not tampons. Shower or tub bathe the day after your procedure. No douching until bleeding stops. Always wipe from front to back after  Elimination.  Activity: Do not drive or operate any equipment today. The effects of the anesthesia are still present and drowsiness may result. Rest today, not  necessarily flat bed rest, just take it easy. You may resume your normal activity in one to 2 days.  Sexual activity: No intercourse for one week or as indicated by your physician  Diet: Eat a light diet as desired this evening. You may resume a regular diet tomorrow.  Return to work: One to 2 days.  General Expectations of your surgery: Vaginal bleeding should be no heavier than a normal period. Spotting may continue up to 10 days. Mild cramps may continue for a couple of days. You may have a regular period in 2-6 weeks.  Unexpected observations call your doctor if these occur: persistent or heavy bleeding. Severe abdominal cramping or pain. Elevation of temperature greater than 100F.  Call for an appointment in one week.

## 2018-08-12 NOTE — Anesthesia Postprocedure Evaluation (Signed)
Anesthesia Post Note  Patient: RYEN HEITMEYER  Procedure(s) Performed: DILATATION AND CURETTAGE /HYSTEROSCOPY (N/A Uterus) MYOMECTOMY CERVICAL (N/A Uterus)     Patient location during evaluation: PACU Anesthesia Type: General Level of consciousness: awake and alert Pain management: pain level controlled Vital Signs Assessment: post-procedure vital signs reviewed and stable Respiratory status: spontaneous breathing, nonlabored ventilation, respiratory function stable and patient connected to nasal cannula oxygen Cardiovascular status: blood pressure returned to baseline and stable Postop Assessment: no apparent nausea or vomiting Anesthetic complications: no    Last Vitals:  Vitals:   08/12/18 1130 08/12/18 1210  BP: 111/74 113/74  Pulse: (!) 57 (!) 58  Resp: 11 13  Temp:  36.6 C  SpO2: 99% 100%    Last Pain:  Vitals:   08/12/18 1215  TempSrc:   PainSc: 3                  Montez Hageman

## 2018-08-12 NOTE — Interval H&P Note (Signed)
History and Physical Interval Note:  08/12/2018 9:31 AM  Christy Alvarado  has presented today for surgery, with the diagnosis of cervical fibroid  The various methods of treatment have been discussed with the patient and family. After consideration of risks, benefits and other options for treatment, the patient has consented to  Procedure(s): DILATATION AND CURETTAGE /HYSTEROSCOPY (N/A) MYOMECTOMY CERVICAL (N/A) as a surgical intervention .  The patient's history has been reviewed, patient examined, no change in status, stable for surgery.  I have reviewed the patient's chart and labs.  Questions were answered to the patient's satisfaction.     Salvadore Dom

## 2018-08-12 NOTE — Op Note (Signed)
Preoperative Diagnosis: prolapsing cervical fibroid  Postoperative Diagnosis: same  Procedure: Hysteroscopy, myomectomy  Surgeon: Dr Sumner Boast  Assistants: Dr Edwinna Areola  Anesthesia: General via LMA  EBL: 5 cc  Fluids: 700 cc LR  Fluid deficit: 250 cc  Urine output: not recorded  Indications for surgery: The patient is a 32 yo female, with a known cervical fibroid who presented with increased vaginal bleeding and was noted to have the fibroid prolapsing into the vagibna. She had an ultrasound in 6/19 that revealed a 2.3 cm mass c/w a myoma. UPT yesterday was negative, CBC was normal.  The risks of the surgery were reviewed with the patient and the consent form was signed prior to her surgery.  Findings: EUA: 2 cm prolapsing cervical myoma. Normal sized, mobile uterus, no adnexal masses. Hysteroscopy: normal endometrial cavity, normal tubal ostia bilaterally. After the myomectomy there was a base of the myoma in the cervix that was seen and resected hysteroscopically. At the end of the case the cervical canal was normal.   Specimens: cervical myoma   Procedure: The patient was taken to the operating room with an IV in place. She was placed in the dorsal lithotomy position and anesthesia was administered. She was prepped and draped in the usual sterile fashion for a vaginal procedure. She voided on the way to the OR. A weighted speculum was placed in the vagina and a single tooth tenaculum was placed on the anterior lip of the cervix. The cervix was injected with vasopressin at 12, 3, 6 and 9 o'clock. The vasopressin mixture was 20 IU of vasopressin in 100 cc of normal saline. A total of 12 cc was used. A tenaculum was placed on the fibroid and 2 endostitch's of 0-Vicryl were placed around the stalk of the myoma. The fibroid was then cut off of the stalk with cautery.  The cervix was dilated to a #8 hagar dilator. The myosure hysteroscope was inserted into the uterine cavity. With  continuous infusion of normal saline, the uterine cavity was visualized with the above findings. The myosure reach was then used to resect the base of the myoma that was still in the cervix. The myosure was then removed.The single tooth tenaculum was removed. Hemostasis was excellent. The speculum was removed. The patients perineum was cleansed of betadine and she was taken out of the dorsal lithotomy position.  Upon awakening the LMA was removed and the patient was transferred to the recovery room in stable and awake condition.  The sponge and instrument count were correct. There were no complications.    CC: Dr Brigitte Pulse

## 2018-08-12 NOTE — H&P (Signed)
GYNECOLOGY  VISIT   HPI: 32 y.o.   [name]   G0P0000 with No LMP recorded (lmp unknown).   here for irregular menses on OCP.   The patient has a cervical myoma.  She has been on OCP's for about 4 months. Prior to the pill she was having regular, heavy cycles.  Since starting the pill she is having been having spotting most of the time. She has a little more of a cycle the week she is supposed to be bleeding. Hasn't been heavy until the last week. She is currently in the middle of a pill pack.  She had sex last Wednesday, started having heavy vaginal bleeding and has been bleeding since thin. Initially she was saturating a regular tampon in a few hours.  Saturday night she bleed through 3 super tampons in an hour. Bleeding has slowed down again. Currently saturating a super tampon in an hour.  She is very tired, not light headed or dizzy. Slight cramping, some lower back pain.  GYNECOLOGIC HISTORY: No LMP recorded (lmp unknown). Contraception:OCP Menopausal hormone therapy: n/a                OB History    Gravida  0   Para  0   Term  0   Preterm  0   AB  0   Living  0     SAB  0   TAB  0   Ectopic  0   Multiple  0   Live Births                 Patient Active Problem List   Diagnosis Date Noted  . Iron deficiency anemia 09/01/2012  . Exercise-induced asthma 09/01/2012  . Depression   . ADD (attention deficit disorder)         Past Medical History:  Diagnosis Date  . ADD (attention deficit disorder)   . Allergy   . Anemia   . Anxiety   . Asthma   . Depression   . Seasonal allergies          Past Surgical History:  Procedure Laterality Date  . RECONSTRUCTION MANDIBLE / MAXILLA     underbite correction          Current Outpatient Medications  Medication Sig Dispense Refill  . FLUoxetine (PROZAC) 20 MG tablet Take 1 tablet (20 mg total) by mouth daily. 90 tablet 3  . methylphenidate 18 MG PO CR tablet  Take 1 tablet (18 mg total) by mouth daily. 30 tablet 0  . norethindrone-ethinyl estradiol (JUNEL FE,GILDESS FE,LOESTRIN FE) 1-20 MG-MCG tablet Take 1 tablet by mouth daily. 1 Package 11   No current facility-administered medications for this visit.      ALLERGIES: Patient has no known allergies.       Family History  Problem Relation Age of Onset  . Cancer Father        prostate  . Heart disease Maternal Grandmother   . Hyperlipidemia Maternal Grandmother   . Stroke Maternal Grandmother   . Hypertension Maternal Grandmother     Social History        Socioeconomic History  . Marital status: Single    Spouse name: n/a  . Number of children: 0  . Years of education: 15+  . Highest education level: Not on file  Occupational History  . 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  Financial resource strain: Not on file  . Food insecurity:    Worry: Not on file    Inability: Not on file  . Transportation needs:    Medical: Not on file    Non-medical: Not on file  Tobacco Use  . Smoking status: Never Smoker  . Smokeless tobacco: Never Used  Substance and Sexual Activity  . Alcohol use: Yes    Alcohol/week: 5.0 standard drinks    Types: 5 Cans of beer per week  . Drug use: No  . Sexual activity: Yes    Partners: Male    Birth control/protection: Condom, Pill  Lifestyle  . Physical activity:    Days per week: Not on file    Minutes per session: Not on file  . Stress: Not on file  Relationships  . Social connections:    Talks on phone: Not on file    Gets together: Not on file    Attends religious service: Not on file    Active member of club or organization: Not on file    Attends meetings of clubs or organizations: Not on file    Relationship status: Not on file  . Intimate partner violence:    Fear of current or ex partner: Not on file     Emotionally abused: Not on file    Physically abused: Not on file    Forced sexual activity: Not on file  Other Topics Concern  . Not on file  Social History Narrative   Lives with 2 sisters (and soon-to-be brother-in-law) in the house next-door to their parents.    Review of Systems  Constitutional: Negative.        Weight gain  HENT: Negative.   Eyes: Negative.   Respiratory: Negative.   Cardiovascular: Negative.   Gastrointestinal: Negative.        Blaoting  Genitourinary: Positive for frequency and urgency.       Irregular menses Loss of sexual interest Pain with intercourse  Musculoskeletal: Negative.   Skin: Negative.   Neurological: Negative.   Endo/Heme/Allergies: Negative.   Psychiatric/Behavioral: Negative.   All other systems reviewed and are negative.   PHYSICAL EXAMINATION:    BP 100/72   Pulse 84   Wt 150 lb (68 kg)   LMP  (LMP Unknown)   BMI 26.25 kg/m     General appearance: alert, cooperative and appears stated age Neck: no adenopathy, supple, symmetrical, trachea midline and thyroid normal to inspection and palpation  Heart: regular rate and rhythm Lungs: CTAB Abdomen: soft, non-tender; bowel sounds normal; no masses,  no organomegaly Extremities: normal, atraumatic, no cyanosis Skin: normal color, texture and turgor, no rashes or lesions Lymph: normal cervical supraclavicular and inguinal nodes Neurologic: grossly normal    Pelvic: External genitalia:  no lesions              Urethra:  normal appearing urethra with no masses, tenderness or lesions              Bartholins and Skenes: normal                 Vagina: normal appearing vagina with normal color and discharge, no lesions              Cervix: 2 cm prolapsed cervical fibroid, no sign of infection. Pedicle palpated. No cervical motion tenderness.               Bimanual Exam:  Uterus:  normal size, contour, position, consistency, mobility, non-tender  Adnexa: no  mass, fullness, tenderness               Chaperone was present for exam.  ASSESSMENT Prolapsing cervical myoma Very heavy bleeding over the weekend, currently not actively bleeding    PLAN UPT negative Hgb 11.6 Send CBC, Ferritin Plan cervical myomectomy tomorrow (patient stable, has eaten this am) Risks of bleeding, infection, need for further surgery reviewed with the patient and her mother.    An After Visit Summary was printed and given to the patient.

## 2018-08-12 NOTE — Anesthesia Preprocedure Evaluation (Signed)
Anesthesia Evaluation  Patient identified by MRN, date of birth, ID band Patient awake    Reviewed: Allergy & Precautions, NPO status , Patient's Chart, lab work & pertinent test results  Airway Mallampati: II  TM Distance: >3 FB Neck ROM: Full    Dental no notable dental hx.    Pulmonary neg pulmonary ROS,    Pulmonary exam normal breath sounds clear to auscultation       Cardiovascular negative cardio ROS Normal cardiovascular exam Rhythm:Regular Rate:Normal     Neuro/Psych negative neurological ROS  negative psych ROS   GI/Hepatic negative GI ROS, Neg liver ROS,   Endo/Other  negative endocrine ROS  Renal/GU negative Renal ROS  negative genitourinary   Musculoskeletal negative musculoskeletal ROS (+)   Abdominal   Peds negative pediatric ROS (+)  Hematology negative hematology ROS (+)   Anesthesia Other Findings   Reproductive/Obstetrics negative OB ROS                             Anesthesia Physical Anesthesia Plan  ASA: I  Anesthesia Plan: General   Post-op Pain Management:    Induction: Intravenous  PONV Risk Score and Plan: 4 or greater and Ondansetron, Dexamethasone, Midazolam and Scopolamine patch - Pre-op  Airway Management Planned: LMA  Additional Equipment:   Intra-op Plan:   Post-operative Plan:   Informed Consent: I have reviewed the patients History and Physical, chart, labs and discussed the procedure including the risks, benefits and alternatives for the proposed anesthesia with the patient or authorized representative who has indicated his/her understanding and acceptance.   Dental advisory given  Plan Discussed with: CRNA  Anesthesia Plan Comments:         Anesthesia Quick Evaluation

## 2018-08-13 ENCOUNTER — Encounter (HOSPITAL_BASED_OUTPATIENT_CLINIC_OR_DEPARTMENT_OTHER): Payer: Self-pay | Admitting: Obstetrics and Gynecology

## 2018-08-25 ENCOUNTER — Other Ambulatory Visit: Payer: Self-pay

## 2018-08-25 ENCOUNTER — Ambulatory Visit (INDEPENDENT_AMBULATORY_CARE_PROVIDER_SITE_OTHER): Payer: 59 | Admitting: Obstetrics and Gynecology

## 2018-08-25 ENCOUNTER — Encounter: Payer: Self-pay | Admitting: Obstetrics and Gynecology

## 2018-08-25 VITALS — BP 110/70 | Wt 147.0 lb

## 2018-08-25 DIAGNOSIS — Z9889 Other specified postprocedural states: Secondary | ICD-10-CM

## 2018-08-25 NOTE — Progress Notes (Signed)
GYNECOLOGY  VISIT   HPI: 32 y.o.   Single White or Caucasian Not Hispanic or Latino  female   [id number] with Patient's last menstrual period was 08/18/2018.   here for 2 week post op, cervical myomectomy and hysteroscopy 08/12/2018.  She is doing well, no complaints. Currently on the last day of her cycle.  GYNECOLOGIC HISTORY: Patient's last menstrual period was 08/18/2018. Contraception:OCP Menopausal hormone therapy: none        OB History    Gravida  0   Para  0   Term  0   Preterm  0   AB  0   Living  0     SAB  0   TAB  0   Ectopic  0   Multiple  0   Live Births                 Patient Active Problem List   Diagnosis Date Noted  . Iron deficiency anemia 09/01/2012  . Exercise-induced asthma 09/01/2012  . Depression   . ADD (attention deficit disorder)     Past Medical History:  Diagnosis Date  . ADD (attention deficit disorder)   . Anemia   . Anxiety   . Asthma    grew out of it, childhood  . Depression   . Fatigue   . Fibroid of cervix   . Plantar wart of left foot 04/14/2014  . Seasonal allergies   . Syncope, vasovagal     Past Surgical History:  Procedure Laterality Date  . HYSTEROSCOPY W/D&C N/A 08/12/2018   Procedure: DILATATION AND CURETTAGE /HYSTEROSCOPY;  Surgeon: Salvadore Dom, MD;  Location: [facility];  Service: Gynecology;  Laterality: N/A;  . NASAL SEPTUM SURGERY    . PROLAPSED UTERINE FIBROID LIGATION N/A 08/12/2018   Procedure: MYOMECTOMY CERVICAL;  Surgeon: Salvadore Dom, MD;  Location: [facility];  Service: Gynecology;  Laterality: N/A;  . RECONSTRUCTION MANDIBLE / MAXILLA     underbite correction    Current Outpatient Medications  Medication Sig Dispense Refill  . FLUoxetine (PROZAC) 20 MG tablet Take 1 tablet (20 mg total) by mouth daily. 90 tablet 3  . ibuprofen (ADVIL,MOTRIN) 600 MG tablet Take 600 mg by mouth every 6 (six) hours as needed.    . methylphenidate 18 MG PO  CR tablet Take 1 tablet (18 mg total) by mouth daily. (Patient taking differently: Take 18 mg by mouth as needed. ) 30 tablet 0  . norethindrone-ethinyl estradiol (JUNEL FE,GILDESS FE,LOESTRIN FE) 1-20 MG-MCG tablet Take 1 tablet by mouth daily. 1 Package 11   No current facility-administered medications for this visit.      ALLERGIES: Patient has no known allergies.  Family History  Problem Relation Age of Onset  . Cancer Father        prostate  . Heart disease Maternal Grandmother   . Hyperlipidemia Maternal Grandmother   . Stroke Maternal Grandmother   . Hypertension Maternal Grandmother     Social History   Socioeconomic History  . Marital status: Single    Spouse name: n/a  . Number of children: 0  . Years of education: 15+  . Highest education level: Not on file  Occupational History  . 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 Financial resource strain: Not on file  . Food insecurity:    Worry: Not on file    Inability: Not on  file  . Transportation needs:    Medical: Not on file    Non-medical: Not on file  Tobacco Use  . Smoking status: Never Smoker  . Smokeless tobacco: Never Used  Substance and Sexual Activity  . Alcohol use: Yes    Alcohol/week: 5.0 standard drinks    Types: 5 Cans of beer per week  . Drug use: No  . Sexual activity: Yes    Partners: Male    Birth control/protection: Condom, Pill  Lifestyle  . Physical activity:    Days per week: Not on file    Minutes per session: Not on file  . Stress: Not on file  Relationships  . Social connections:    Talks on phone: Not on file    Gets together: Not on file    Attends religious service: Not on file    Active member of club or organization: Not on file    Attends meetings of clubs or organizations: Not on file    Relationship status: Not on file  . Intimate partner violence:    Fear of current or ex partner: Not on file     Emotionally abused: Not on file    Physically abused: Not on file    Forced sexual activity: Not on file  Other Topics Concern  . Not on file  Social History Narrative   Lives with 2 sisters (and soon-to-be brother-in-law) in the house next-door to their parents.    Review of Systems  Constitutional: Negative.   HENT: Negative.   Eyes: Negative.   Respiratory: Negative.   Cardiovascular: Negative.   Gastrointestinal: Negative.   Endocrine: Negative.   Genitourinary: Negative.   Musculoskeletal: Negative.   Skin: Negative.   Allergic/Immunologic: Negative.   Neurological: Negative.   Hematological: Negative.   Psychiatric/Behavioral: Negative.   All other systems reviewed and are negative.   PHYSICAL EXAMINATION:    BP 110/70   Wt 147 lb (66.7 kg)   LMP 08/18/2018 Comment: continous bleeding  BMI 26.04 kg/m     General appearance: alert, cooperative and appears stated age  Pelvic: External genitalia:  no lesions              Urethra:  normal appearing urethra with no masses, tenderness or lesions              Bartholins and Skenes: normal                 Vagina: normal appearing vagina with normal color and discharge, no lesions              Cervix: no cervical motion tenderness and no lesions              Bimanual Exam:  Uterus:  normal size, contour, position, consistency, mobility, non-tender              Adnexa: no mass, fullness, tenderness               Chaperone was present for exam.  ASSESSMENT Post op s/p hysteroscopy, cervical myomectomy. Doing well     PLAN F/U as needed   An After Visit Summary was printed and given to the patient.  CC: Dr Brigitte Pulse

## 2018-12-01 ENCOUNTER — Ambulatory Visit: Payer: 59 | Admitting: Obstetrics and Gynecology

## 2019-01-01 ENCOUNTER — Encounter: Payer: Self-pay | Admitting: Family Medicine

## 2019-01-01 ENCOUNTER — Other Ambulatory Visit: Payer: Self-pay

## 2019-01-01 ENCOUNTER — Ambulatory Visit: Payer: 59 | Admitting: Family Medicine

## 2019-01-01 VITALS — BP 124/76 | HR 69 | Temp 98.5°F | Ht 63.0 in | Wt 153.4 lb

## 2019-01-01 DIAGNOSIS — J45990 Exercise induced bronchospasm: Secondary | ICD-10-CM | POA: Diagnosis not present

## 2019-01-01 DIAGNOSIS — R059 Cough, unspecified: Secondary | ICD-10-CM

## 2019-01-01 DIAGNOSIS — R05 Cough: Secondary | ICD-10-CM | POA: Diagnosis not present

## 2019-01-01 DIAGNOSIS — J019 Acute sinusitis, unspecified: Secondary | ICD-10-CM | POA: Diagnosis not present

## 2019-01-01 MED ORDER — HYDROCODONE-HOMATROPINE 5-1.5 MG/5ML PO SYRP: ORAL_SOLUTION | ORAL | 0 refills | Status: DC

## 2019-01-01 MED ORDER — BENZONATATE 100 MG PO CAPS: 100.0000 mg | ORAL_CAPSULE | Freq: Three times a day (TID) | ORAL | 0 refills | Status: DC | PRN

## 2019-01-01 MED ORDER — AMOXICILLIN-POT CLAVULANATE 875-125 MG PO TABS: 1.0000 | ORAL_TABLET | Freq: Two times a day (BID) | ORAL | 0 refills | Status: DC

## 2019-01-01 MED ORDER — ALBUTEROL SULFATE HFA 108 (90 BASE) MCG/ACT IN AERS: 1.0000 | INHALATION_SPRAY | RESPIRATORY_TRACT | 0 refills | Status: DC | PRN

## 2019-01-01 NOTE — Progress Notes (Signed)
Subjective:    Patient ID: Christy Alvarado, female    DOB: Apr 07, 1986, 33 y.o.   MRN: [id number]  HPI Christy Alvarado is a 33 y.o. female Presents today for: Chief Complaint  Patient presents with  . Sinusitis    head cold since last tue  . Cough    last tue  . Medication Refill    request albuterol inhealer for PRN     Presents with cough, sinus congestion.  History of exercise-induced asthma. Started 9 days ago with runny nose, congestion, sneezing. Started with face pain and tooth pain 6 days ago, more cough few days ago. Discolored nasal discharge. Still with some face pain in forehead. Slight improvement in tooth pain. Out of albuterol. No recent wheeze.  Cough more productive in am. Dry cough at night - keeping up. No dyspnea/wheeze.   Runner - some dyspnea yesterday with run yesterday - 3 mi.   Tx: nyquil. Has felt worse with expectorants in past.   Patient Active Problem List   Diagnosis Date Noted  . Iron deficiency anemia 09/01/2012  . Exercise-induced asthma 09/01/2012  . Depression   . ADD (attention deficit disorder)    Past Medical History:  Diagnosis Date  . ADD (attention deficit disorder)   . Anemia   . Anxiety   . Asthma    grew out of it, childhood  . Depression   . Fatigue   . Fibroid of cervix   . Plantar wart of left foot 04/14/2014  . Seasonal allergies   . Syncope, vasovagal    Past Surgical History:  Procedure Laterality Date  . HYSTEROSCOPY W/D&C N/A 08/12/2018   Procedure: DILATATION AND CURETTAGE /HYSTEROSCOPY;  Surgeon: Salvadore Dom, MD;  Location: [facility];  Service: Gynecology;  Laterality: N/A;  . NASAL SEPTUM SURGERY    . PROLAPSED UTERINE FIBROID LIGATION N/A 08/12/2018   Procedure: MYOMECTOMY CERVICAL;  Surgeon: Salvadore Dom, MD;  Location: [facility];  Service: Gynecology;  Laterality: N/A;  . RECONSTRUCTION MANDIBLE / MAXILLA     underbite correction   No Known Allergies Prior to  Admission medications   Medication Sig Start Date End Date Taking? Authorizing Provider  FLUoxetine (PROZAC) 20 MG tablet Take 1 tablet (20 mg total) by mouth daily. 05/05/18  Yes Shawnee Knapp, MD  ibuprofen (ADVIL,MOTRIN) 600 MG tablet Take 600 mg by mouth every 6 (six) hours as needed.   Yes [provider]  methylphenidate 18 MG PO CR tablet Take 1 tablet (18 mg total) by mouth daily. Patient taking differently: Take 18 mg by mouth as needed.  05/05/18  Yes Shawnee Knapp, MD  norethindrone-ethinyl estradiol (JUNEL FE,GILDESS FE,LOESTRIN FE) 1-20 MG-MCG tablet Take 1 tablet by mouth daily. 05/05/18  Yes Shawnee Knapp, MD  albuterol (PROVENTIL HFA;VENTOLIN HFA) 108 (90 Base) MCG/ACT inhaler Inhale 1-2 puffs into the lungs every 4 (four) hours as needed for wheezing or shortness of breath. 01/01/19   Wendie Agreste, MD  amoxicillin-clavulanate (AUGMENTIN) 875-125 MG tablet Take 1 tablet by mouth 2 (two) times daily. 01/01/19   Wendie Agreste, MD  benzonatate (TESSALON) 100 MG capsule Take 1 capsule (100 mg total) by mouth 3 (three) times daily as needed for cough. 01/01/19   Wendie Agreste, MD  HYDROcodone-homatropine [facility]) 5-1.5 MG/5ML syrup 35m by mouth a bedtime as needed for cough. 01/01/19   Wendie Agreste, MD   Social History   Socioeconomic History  .  Marital status: Single    Spouse name: n/a  . Number of children: 0  . Years of education: 15+  . Highest education level: Not on file  Occupational History  . 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 Financial resource strain: Not on file  . Food insecurity:    Worry: Not on file    Inability: Not on file  . Transportation needs:    Medical: Not on file    Non-medical: Not on file  Tobacco Use  . Smoking status: Never Smoker  . Smokeless tobacco: Never Used  Substance and Sexual Activity  . Alcohol use: Yes    Alcohol/week: 5.0 standard drinks     Types: 5 Cans of beer per week  . Drug use: No  . Sexual activity: Yes    Partners: Male    Birth control/protection: Condom, Pill  Lifestyle  . Physical activity:    Days per week: Not on file    Minutes per session: Not on file  . Stress: Not on file  Relationships  . Social connections:    Talks on phone: Not on file    Gets together: Not on file    Attends religious service: Not on file    Active member of club or organization: Not on file    Attends meetings of clubs or organizations: Not on file    Relationship status: Not on file  . Intimate partner violence:    Fear of current or ex partner: Not on file    Emotionally abused: Not on file    Physically abused: Not on file    Forced sexual activity: Not on file  Other Topics Concern  . Not on file  Social History Narrative   Lives with 2 sisters (and soon-to-be brother-in-law) in the house next-door to their parents.    Review of Systems  Per HPI.     Objective:   Physical Exam Vitals signs reviewed.  Constitutional:      General: She is not in acute distress.    Appearance: She is well-developed.  HENT:     Head: Normocephalic and atraumatic.     Right Ear: Hearing, tympanic membrane, ear canal and external ear normal.     Left Ear: Hearing, tympanic membrane, ear canal and external ear normal.     Nose: Nose normal.     Mouth/Throat:     Pharynx: No oropharyngeal exudate.  Eyes:     Conjunctiva/sclera: Conjunctivae normal.     Pupils: Pupils are equal, round, and reactive to light.  Cardiovascular:     Rate and Rhythm: Normal rate and regular rhythm.     Heart sounds: Normal heart sounds. No murmur.  Pulmonary:     Effort: Pulmonary effort is normal. No respiratory distress.     Breath sounds: Normal breath sounds. No wheezing or rhonchi.  Skin:    General: Skin is warm and dry.     Findings: No rash.  Neurological:     Mental Status: She is alert and oriented to person, place, and time.    Psychiatric:        Behavior: Behavior normal.    Vitals:   01/01/19 1129  BP: 124/76  Pulse: 69  Temp: 98.5 F (36.9 C)  TempSrc: Oral  SpO2: 96%  Weight: 153 lb 6.4 oz (69.6 kg)  Height: 5\' 3"  (1.6 m)  Assessment & Plan:    Christy Alvarado is a 33 y.o. female Acute sinusitis, recurrence not specified, unspecified location - Plan: amoxicillin-clavulanate (AUGMENTIN) 875-125 MG tablet  Exercise-induced asthma - Plan: albuterol (PROVENTIL HFA;VENTOLIN HFA) 108 (90 Base) MCG/ACT inhaler  Cough - Plan: benzonatate (TESSALON) 100 MG capsule, HYDROcodone-homatropine (HYCODAN) 5-1.5 MG/5ML syrup  Suspected viral illness, possible secondary early sinusitis.  History of exercise-induced asthma, but lungs clear at present.  -Symptomatic care with saline nasal spray, fluids, relative rest discussed.  Does note that sinus symptoms may be slightly improving.  Augmentin printed if not continuing to improve.  Potential side effects and risk discussed.  -Tessalon Perles during the day or albuterol if needed for cough/wheeze.  Hydrocodone cough syrup at night if needed but do not use if short of breath or wheezing, instead instructed to use albuterol.  RTC precautions given  Meds ordered this encounter  Medications  . albuterol (PROVENTIL HFA;VENTOLIN HFA) 108 (90 Base) MCG/ACT inhaler    Sig: Inhale 1-2 puffs into the lungs every 4 (four) hours as needed for wheezing or shortness of breath.    Dispense:  1 Inhaler    Refill:  0  . benzonatate (TESSALON) 100 MG capsule    Sig: Take 1 capsule (100 mg total) by mouth 3 (three) times daily as needed for cough.    Dispense:  20 capsule    Refill:  0  . HYDROcodone-homatropine (HYCODAN) 5-1.5 MG/5ML syrup    Sig: 72m by mouth a bedtime as needed for cough.    Dispense:  120 mL    Refill:  0  . amoxicillin-clavulanate (AUGMENTIN) 875-125 MG tablet    Sig: Take 1 tablet by mouth 2 (two) times daily.    Dispense:  20 tablet    Refill:   0   Patient Instructions   Current symptoms could still be due to a virus.  Saline nasal spray, Tessalon Perles as needed during the day, hydrocodone cough syrup if needed at night for cough but use albuterol if any wheezing or shortness of breath.  If not improved in the next few days, I did print antibiotic for possible sinus infection.  See information on both below.  Thank you for coming in today.  Return to the clinic or go to the nearest emergency room if any of your symptoms worsen or new symptoms occur.   Sinusitis, Adult Sinusitis is inflammation of your sinuses. Sinuses are hollow spaces in the bones around your face. Your sinuses are located:  Around your eyes.  In the middle of your forehead.  Behind your nose.  In your cheekbones. Mucus normally drains out of your sinuses. When your nasal tissues become inflamed or swollen, mucus can become trapped or blocked. This allows bacteria, viruses, and fungi to grow, which leads to infection. Most infections of the sinuses are caused by a virus. Sinusitis can develop quickly. It can last for up to 4 weeks (acute) or for more than 12 weeks (chronic). Sinusitis often develops after a cold. What are the causes? This condition is caused by anything that creates swelling in the sinuses or stops mucus from draining. This includes:  Allergies.  Asthma.  Infection from bacteria or viruses.  Deformities or blockages in your nose or sinuses.  Abnormal growths in the nose (nasal polyps).  Pollutants, such as chemicals or irritants in the air.  Infection from fungi (rare). What increases the risk? You are more likely to develop this condition if you:  Have a  weak body defense system (immune system).  Do a lot of swimming or diving.  Overuse nasal sprays.  Smoke. What are the signs or symptoms? The main symptoms of this condition are pain and a feeling of pressure around the affected sinuses. Other symptoms include:  Stuffy  nose or congestion.  Thick drainage from your nose.  Swelling and warmth over the affected sinuses.  Headache.  Upper toothache.  A cough that may get worse at night.  Extra mucus that collects in the throat or the back of the nose (postnasal drip).  Decreased sense of smell and taste.  Fatigue.  A fever.  Sore throat.  Bad breath. How is this diagnosed? This condition is diagnosed based on:  Your symptoms.  Your medical history.  A physical exam.  Tests to find out if your condition is acute or chronic. This may include: ? Checking your nose for nasal polyps. ? Viewing your sinuses using a device that has a light (endoscope). ? Testing for allergies or bacteria. ? Imaging tests, such as an MRI or CT scan. In rare cases, a bone biopsy may be done to rule out more serious types of fungal sinus disease. How is this treated? Treatment for sinusitis depends on the cause and whether your condition is chronic or acute.  If caused by a virus, your symptoms should go away on their own within 10 days. You may be given medicines to relieve symptoms. They include: ? Medicines that shrink swollen nasal passages (topical intranasal decongestants). ? Medicines that treat allergies (antihistamines). ? A spray that eases inflammation of the nostrils (topical intranasal corticosteroids). ? Rinses that help get rid of thick mucus in your nose (nasal saline washes).  If caused by bacteria, your health care provider may recommend waiting to see if your symptoms improve. Most bacterial infections will get better without antibiotic medicine. You may be given antibiotics if you have: ? A severe infection. ? A weak immune system.  If caused by narrow nasal passages or nasal polyps, you may need to have surgery. Follow these instructions at home: Medicines  Take, use, or apply over-the-counter and prescription medicines only as told by your health care provider. These may include nasal  sprays.  If you were prescribed an antibiotic medicine, take it as told by your health care provider. Do not stop taking the antibiotic even if you start to feel better. Hydrate and humidify   Drink enough fluid to keep your urine pale yellow. Staying hydrated will help to thin your mucus.  Use a cool mist humidifier to keep the humidity level in your home above 50%.  Inhale steam for 10-15 minutes, 3-4 times a day, or as told by your health care provider. You can do this in the bathroom while a hot shower is running.  Limit your exposure to cool or dry air. Rest  Rest as much as possible.  Sleep with your head raised (elevated).  Make sure you get enough sleep each night. General instructions   Apply a warm, moist washcloth to your face 3-4 times a day or as told by your health care provider. This will help with discomfort.  Wash your hands often with soap and water to reduce your exposure to germs. If soap and water are not available, use hand sanitizer.  Do not smoke. Avoid being around people who are smoking (secondhand smoke).  Keep all follow-up visits as told by your health care provider. This is important. Contact a health care provider  if:  You have a fever.  Your symptoms get worse.  Your symptoms do not improve within 10 days. Get help right away if:  You have a severe headache.  You have persistent vomiting.  You have severe pain or swelling around your face or eyes.  You have vision problems.  You develop confusion.  Your neck is stiff.  You have trouble breathing. Summary  Sinusitis is soreness and inflammation of your sinuses. Sinuses are hollow spaces in the bones around your face.  This condition is caused by nasal tissues that become inflamed or swollen. The swelling traps or blocks the flow of mucus. This allows bacteria, viruses, and fungi to grow, which leads to infection.  If you were prescribed an antibiotic medicine, take it as told by  your health care provider. Do not stop taking the antibiotic even if you start to feel better.  Keep all follow-up visits as told by your health care provider. This is important. This information is not intended to replace advice given to you by your health care provider. Make sure you discuss any questions you have with your health care provider. Document Released: 11/19/2005 Document Revised: 04/21/2018 Document Reviewed: 04/21/2018 Elsevier Interactive Patient Education  2019 [employer].    Upper Respiratory Infection, Adult An upper respiratory infection (URI) affects the nose, throat, and upper air passages. URIs are caused by germs (viruses). The most common type of URI is often called "the common cold." Medicines cannot cure URIs, but you can do things at home to relieve your symptoms. URIs usually get better within 7-10 days. Follow these instructions at home: Activity  Rest as needed.  If you have a fever, stay home from work or school until your fever is gone, or until your doctor says you may return to work or school. ? You should stay home until you cannot spread the infection anymore (you are not contagious). ? Your doctor may have you wear a face mask so you have less risk of spreading the infection. Relieving symptoms  Gargle with a salt-water mixture 3-4 times a day or as needed. To make a salt-water mixture, completely dissolve -1 tsp of salt in 1 cup of warm water.  Use a cool-mist humidifier to add moisture to the air. This can help you breathe more easily. Eating and drinking   Drink enough fluid to keep your pee (urine) pale yellow.  Eat soups and other clear broths. General instructions   Take over-the-counter and prescription medicines only as told by your doctor. These include cold medicines, fever reducers, and cough suppressants.  Do not use any products that contain nicotine or tobacco. These include cigarettes and e-cigarettes. If you need help  quitting, ask your doctor.  Avoid being where people are smoking (avoid secondhand smoke).  Make sure you get regular shots and get the flu shot every year.  Keep all follow-up visits as told by your doctor. This is important. How to avoid spreading infection to others   Wash your hands often with soap and water. If you do not have soap and water, use hand sanitizer.  Avoid touching your mouth, face, eyes, or nose.  Cough or sneeze into a tissue or your sleeve or elbow. Do not cough or sneeze into your hand or into the air. Contact a doctor if:  You are getting worse, not better.  You have any of these: ? A fever. ? Chills. ? Brown or red mucus in your nose. ? Yellow or brown  fluid (discharge)coming from your nose. ? Pain in your face, especially when you bend forward. ? Swollen neck glands. ? Pain with swallowing. ? White areas in the back of your throat. Get help right away if:  You have shortness of breath that gets worse.  You have very bad or constant: ? Headache. ? Ear pain. ? Pain in your forehead, behind your eyes, and over your cheekbones (sinus pain). ? Chest pain.  You have long-lasting (chronic) lung disease along with any of these: ? Wheezing. ? Long-lasting cough. ? Coughing up blood. ? A change in your usual mucus.  You have a stiff neck.  You have changes in your: ? Vision. ? Hearing. ? Thinking. ? Mood. Summary  An upper respiratory infection (URI) is caused by a germ called a virus. The most common type of URI is often called "the common cold."  URIs usually get better within 7-10 days.  Take over-the-counter and prescription medicines only as told by your doctor. This information is not intended to replace advice given to you by your health care provider. Make sure you discuss any questions you have with your health care provider. Document Released: 05/07/2008 Document Revised: 07/12/2017 Document Reviewed: 07/12/2017 Elsevier Interactive  Patient Education  [employer].   If you have lab work done today you will be contacted with your lab results within the next 2 weeks.  If you have not heard from [place] then please contact us. The fastest way to get your results is to register for My Chart.   IF you received an x-ray today, you will receive an invoice from [facility]. Please contact [facility] at [phone] with questions or concerns regarding your invoice.   IF you received labwork today, you will receive an invoice from [facility]. Please contact [facility] at [phone] with questions or concerns regarding your invoice.   Our billing staff will not be able to assist you with questions regarding bills from these companies.  You will be contacted with the lab results as soon as they are available. The fastest way to get your results is to activate your My Chart account. Instructions are located on the last page of this paperwork. If you have not heard from [place] regarding the results in 2 weeks, please contact this office.       Signed,   Merri Ray, MD [facility].  01/01/19 12:35 PM

## 2019-01-01 NOTE — Patient Instructions (Addendum)
Current symptoms could still be due to a virus.  Saline nasal spray, Tessalon Perles as needed during the day, hydrocodone cough syrup if needed at night for cough but use albuterol if any wheezing or shortness of breath.  If not improved in the next few days, I did print antibiotic for possible sinus infection.  See information on both below.  Thank you for coming in today.  Return to the clinic or go to the nearest emergency room if any of your symptoms worsen or new symptoms occur.   Sinusitis, Adult Sinusitis is inflammation of your sinuses. Sinuses are hollow spaces in the bones around your face. Your sinuses are located:  Around your eyes.  In the middle of your forehead.  Behind your nose.  In your cheekbones. Mucus normally drains out of your sinuses. When your nasal tissues become inflamed or swollen, mucus can become trapped or blocked. This allows bacteria, viruses, and fungi to grow, which leads to infection. Most infections of the sinuses are caused by a virus. Sinusitis can develop quickly. It can last for up to 4 weeks (acute) or for more than 12 weeks (chronic). Sinusitis often develops after a cold. What are the causes? This condition is caused by anything that creates swelling in the sinuses or stops mucus from draining. This includes:  Allergies.  Asthma.  Infection from bacteria or viruses.  Deformities or blockages in your nose or sinuses.  Abnormal growths in the nose (nasal polyps).  Pollutants, such as chemicals or irritants in the air.  Infection from fungi (rare). What increases the risk? You are more likely to develop this condition if you:  Have a weak body defense system (immune system).  Do a lot of swimming or diving.  Overuse nasal sprays.  Smoke. What are the signs or symptoms? The main symptoms of this condition are pain and a feeling of pressure around the affected sinuses. Other symptoms include:  Stuffy nose or congestion.  Thick  drainage from your nose.  Swelling and warmth over the affected sinuses.  Headache.  Upper toothache.  A cough that may get worse at night.  Extra mucus that collects in the throat or the back of the nose (postnasal drip).  Decreased sense of smell and taste.  Fatigue.  A fever.  Sore throat.  Bad breath. How is this diagnosed? This condition is diagnosed based on:  Your symptoms.  Your medical history.  A physical exam.  Tests to find out if your condition is acute or chronic. This may include: ? Checking your nose for nasal polyps. ? Viewing your sinuses using a device that has a light (endoscope). ? Testing for allergies or bacteria. ? Imaging tests, such as an MRI or CT scan. In rare cases, a bone biopsy may be done to rule out more serious types of fungal sinus disease. How is this treated? Treatment for sinusitis depends on the cause and whether your condition is chronic or acute.  If caused by a virus, your symptoms should go away on their own within 10 days. You may be given medicines to relieve symptoms. They include: ? Medicines that shrink swollen nasal passages (topical intranasal decongestants). ? Medicines that treat allergies (antihistamines). ? A spray that eases inflammation of the nostrils (topical intranasal corticosteroids). ? Rinses that help get rid of thick mucus in your nose (nasal saline washes).  If caused by bacteria, your health care provider may recommend waiting to see if your symptoms improve. Most bacterial infections will  get better without antibiotic medicine. You may be given antibiotics if you have: ? A severe infection. ? A weak immune system.  If caused by narrow nasal passages or nasal polyps, you may need to have surgery. Follow these instructions at home: Medicines  Take, use, or apply over-the-counter and prescription medicines only as told by your health care provider. These may include nasal sprays.  If you were  prescribed an antibiotic medicine, take it as told by your health care provider. Do not stop taking the antibiotic even if you start to feel better. Hydrate and humidify   Drink enough fluid to keep your urine pale yellow. Staying hydrated will help to thin your mucus.  Use a cool mist humidifier to keep the humidity level in your home above 50%.  Inhale steam for 10-15 minutes, 3-4 times a day, or as told by your health care provider. You can do this in the bathroom while a hot shower is running.  Limit your exposure to cool or dry air. Rest  Rest as much as possible.  Sleep with your head raised (elevated).  Make sure you get enough sleep each night. General instructions   Apply a warm, moist washcloth to your face 3-4 times a day or as told by your health care provider. This will help with discomfort.  Wash your hands often with soap and water to reduce your exposure to germs. If soap and water are not available, use hand sanitizer.  Do not smoke. Avoid being around people who are smoking (secondhand smoke).  Keep all follow-up visits as told by your health care provider. This is important. Contact a health care provider if:  You have a fever.  Your symptoms get worse.  Your symptoms do not improve within 10 days. Get help right away if:  You have a severe headache.  You have persistent vomiting.  You have severe pain or swelling around your face or eyes.  You have vision problems.  You develop confusion.  Your neck is stiff.  You have trouble breathing. Summary  Sinusitis is soreness and inflammation of your sinuses. Sinuses are hollow spaces in the bones around your face.  This condition is caused by nasal tissues that become inflamed or swollen. The swelling traps or blocks the flow of mucus. This allows bacteria, viruses, and fungi to grow, which leads to infection.  If you were prescribed an antibiotic medicine, take it as told by your health care  provider. Do not stop taking the antibiotic even if you start to feel better.  Keep all follow-up visits as told by your health care provider. This is important. This information is not intended to replace advice given to you by your health care provider. Make sure you discuss any questions you have with your health care provider. Document Released: 11/19/2005 Document Revised: 04/21/2018 Document Reviewed: 04/21/2018 Elsevier Interactive Patient Education  2019 [employer] [place].    Upper Respiratory Infection, Adult An upper respiratory infection (URI) affects the nose, throat, and upper air passages. URIs are caused by germs (viruses). The most common type of URI is often called "the common cold." Medicines cannot cure URIs, but you can do things at home to relieve your symptoms. URIs usually get better within 7-10 days. Follow these instructions at home: Activity  Rest as needed.  If you have a fever, stay home from work or school until your fever is gone, or until your doctor says you may return to work or school. ? You should  stay home until you cannot spread the infection anymore (you are not contagious). ? Your doctor may have you wear a face mask so you have less risk of spreading the infection. Relieving symptoms  Gargle with a salt-water mixture 3-4 times a day or as needed. To make a salt-water mixture, completely dissolve -1 tsp of salt in 1 cup of warm water.  Use a cool-mist humidifier to add moisture to the air. This can help you breathe more easily. Eating and drinking   Drink enough fluid to keep your pee (urine) pale yellow.  Eat soups and other clear broths. General instructions   Take over-the-counter and prescription medicines only as told by your doctor. These include cold medicines, fever reducers, and cough suppressants.  Do not use any products that contain nicotine or tobacco. These include cigarettes and e-cigarettes. If you need help quitting, ask your  doctor.  Avoid being where people are smoking (avoid secondhand smoke).  Make sure you get regular shots and get the flu shot every year.  Keep all follow-up visits as told by your doctor. This is important. How to avoid spreading infection to others   Wash your hands often with soap and water. If you do not have soap and water, use hand sanitizer.  Avoid touching your mouth, face, eyes, or nose.  Cough or sneeze into a tissue or your sleeve or elbow. Do not cough or sneeze into your hand or into the air. Contact a doctor if:  You are getting worse, not better.  You have any of these: ? A fever. ? Chills. ? Brown or red mucus in your nose. ? Yellow or brown fluid (discharge)coming from your nose. ? Pain in your face, especially when you bend forward. ? Swollen neck glands. ? Pain with swallowing. ? White areas in the back of your throat. Get help right away if:  You have shortness of breath that gets worse.  You have very bad or constant: ? Headache. ? Ear pain. ? Pain in your forehead, behind your eyes, and over your cheekbones (sinus pain). ? Chest pain.  You have long-lasting (chronic) lung disease along with any of these: ? Wheezing. ? Long-lasting cough. ? Coughing up blood. ? A change in your usual mucus.  You have a stiff neck.  You have changes in your: ? Vision. ? Hearing. ? Thinking. ? Mood. Summary  An upper respiratory infection (URI) is caused by a germ called a virus. The most common type of URI is often called "the common cold."  URIs usually get better within 7-10 days.  Take over-the-counter and prescription medicines only as told by your doctor. This information is not intended to replace advice given to you by your health care provider. Make sure you discuss any questions you have with your health care provider. Document Released: 05/07/2008 Document Revised: 07/12/2017 Document Reviewed: 07/12/2017 Elsevier Interactive Patient Education   [employer].   If you have lab work done today you will be contacted with your lab results within the next 2 weeks.  If you have not heard from [place] then please contact us. The fastest way to get your results is to register for My Chart.   IF you received an x-ray today, you will receive an invoice from [facility]. Please contact [facility] at [phone] with questions or concerns regarding your invoice.   IF you received labwork today, you will receive an invoice from [place]. Please contact [facility] at [phone] with questions or  concerns regarding your invoice.   Our billing staff will not be able to assist you with questions regarding bills from these companies.  You will be contacted with the lab results as soon as they are available. The fastest way to get your results is to activate your My Chart account. Instructions are located on the last page of this paperwork. If you have not heard from [place] regarding the results in 2 weeks, please contact this office.

## 2019-01-24 ENCOUNTER — Other Ambulatory Visit: Payer: Self-pay | Admitting: Family Medicine

## 2019-01-24 DIAGNOSIS — J45990 Exercise induced bronchospasm: Secondary | ICD-10-CM

## 2019-01-26 NOTE — Telephone Encounter (Signed)
Requested medication (s) are due for refill today: yes  Requested medication (s) are on the active medication list: yes  Last refill:  01/01/2019  Future visit scheduled: no  Notes to clinic:  Beta agonists failed  Requested Prescriptions  Pending Prescriptions Disp Refills   VENTOLIN HFA 108 (90 Base) MCG/ACT inhaler [Pharmacy Med Name: VENTOLIN HFA 90 MCG INHALER] 18 Inhaler 0    Sig: INHALE 1-2 PUFFS INTO THE LUNGS EVERY 4 (FOUR) HOURS AS NEEDED FOR WHEEZING OR SHORTNESS OF BREATH.     Pulmonology:  Beta Agonists Failed - 01/24/2019 12:08 AM      Failed - One inhaler should last at least one month. If the patient is requesting refills earlier, contact the patient to check for uncontrolled symptoms.      Passed - Valid encounter within last 12 months    Recent Outpatient Visits          3 weeks ago Acute sinusitis, recurrence not specified, unspecified location   [facility], Ranell Patrick, MD   8 months ago Acute maxillary sinusitis, recurrence not specified   [facility], [place] D, PA-C   8 months ago Annual physical exam   [facility], Laurey Arrow, MD   10 months ago Fatigue, unspecified type   [facility], Laurey Arrow, MD   3 years ago ADD (attention deficit disorder)   [facility] at [facility], Linton Ham, MD

## 2019-03-29 ENCOUNTER — Other Ambulatory Visit: Payer: Self-pay | Admitting: Family Medicine

## 2019-03-29 NOTE — Telephone Encounter (Signed)
Can this patient receive a refill on this medication

## 2019-05-19 ENCOUNTER — Telehealth: Payer: Self-pay | Admitting: Family Medicine

## 2019-05-19 ENCOUNTER — Encounter: Payer: Self-pay | Admitting: Family Medicine

## 2019-05-19 NOTE — Telephone Encounter (Signed)
Copied from [place] [id number]. Topic: Quick Communication - Rx Refill/Question >> May 19, 2019 12:38 PM Keene Breath wrote: Medication: FLUoxetine (PROZAC) 20 MG tablet  Patient called to request a refill for the above medication  Preferred Pharmacy (with phone number or street name): [id number] [place], [place] [phone] (Phone) [phone] (Fax)

## 2019-05-19 NOTE — Telephone Encounter (Signed)
Copied from [place] [id number]. Topic: Quick Communication - Rx Refill/Question >> May 19, 2019 12:38 PM Keene Breath wrote: Medication: FLUoxetine (PROZAC) 20 MG tablet  Patient called to request a refill for the above medication  Preferred Pharmacy (with phone number or street name): [id number] [place], [place]        [phone] (Phone) [phone] (Fax)

## 2019-05-19 NOTE — Telephone Encounter (Signed)
error:[id number] ° °

## 2019-05-28 ENCOUNTER — Other Ambulatory Visit: Payer: Self-pay | Admitting: Family Medicine

## 2019-05-28 NOTE — Telephone Encounter (Signed)
Forwarding medication refill to provider for review. 

## 2019-07-01 ENCOUNTER — Telehealth: Payer: Self-pay | Admitting: Obstetrics and Gynecology

## 2019-07-01 MED ORDER — FLUOXETINE HCL 20 MG PO TABS: 20.0000 mg | ORAL_TABLET | Freq: Every day | ORAL | 0 refills | Status: DC

## 2019-07-01 NOTE — Telephone Encounter (Signed)
Spoke with patient.   1. Patient is requesting to schedule IUD insertion. S/p myomectomy and hysteroscopy 08/2018, on OCP Junel Fe. Patient reports continued irregular menses on OCP. Goes from no menses to bleeding for 2wks. LMP 7/16 -7/25. Not currently bleeding, denies any other GYN symptoms. Denies SHOB, weakness, lightheadedness, fatigue.   2. Requesting refill of Prozac, managed by PCP. States she has not seen PCP recently. Advised patient she will need to f/u with PCP for refills of Prozac.   Advised patient OV for contraceptive consult and evaluation needed before proceeding with IUD, patient agreeable. OV scheduled for 8/10 at 10:30am with Dr. Talbert Nan.   Routing to provider for final review. Patient is agreeable to disposition. Will close encounter.

## 2019-07-01 NOTE — Addendum Note (Signed)
Addended by: Dorothy Spark on: 07/01/2019 02:23 PM   Modules accepted: Orders

## 2019-07-01 NOTE — Telephone Encounter (Signed)
Please let the patient know that I'm happy to call in her prozac for her. Dr Brigitte Pulse just changed practices and I don't know if her office is open. I'll send in 90 tablets of prozac. I think she is also due for an annual. Can you change her next visit to an annual (if that's okay with her).

## 2019-07-01 NOTE — Telephone Encounter (Signed)
Patient left voicemail over lunch requesting to have an IUD inserted.

## 2019-07-02 ENCOUNTER — Telehealth: Payer: Self-pay | Admitting: Obstetrics and Gynecology

## 2019-07-02 NOTE — Telephone Encounter (Signed)
Left message to call Taira Knabe at [phone]. 

## 2019-07-02 NOTE — Telephone Encounter (Signed)
Patient says she is returning a call to [place].

## 2019-07-02 NOTE — Telephone Encounter (Signed)
Please see telephone encounter dated 07/01/2019. Encounter closed.

## 2019-07-02 NOTE — Telephone Encounter (Signed)
Spoke with patient. Advised of message as seen below from [place]. Patient is agreeable to change appointment to aex. Appointment changed. Encounter closed.

## 2019-07-09 ENCOUNTER — Other Ambulatory Visit: Payer: Self-pay

## 2019-07-13 ENCOUNTER — Ambulatory Visit: Payer: 59 | Admitting: Obstetrics and Gynecology

## 2019-07-13 ENCOUNTER — Encounter: Payer: Self-pay | Admitting: Obstetrics and Gynecology

## 2019-07-13 ENCOUNTER — Other Ambulatory Visit: Payer: Self-pay

## 2019-07-13 VITALS — BP 120/84 | HR 60 | Temp 98.6°F | Ht 63.0 in | Wt 160.6 lb

## 2019-07-13 DIAGNOSIS — Z0000 Encounter for general adult medical examination without abnormal findings: Secondary | ICD-10-CM | POA: Diagnosis not present

## 2019-07-13 DIAGNOSIS — Z23 Encounter for immunization: Secondary | ICD-10-CM | POA: Diagnosis not present

## 2019-07-13 DIAGNOSIS — Z01419 Encounter for gynecological examination (general) (routine) without abnormal findings: Secondary | ICD-10-CM | POA: Diagnosis not present

## 2019-07-13 DIAGNOSIS — N921 Excessive and frequent menstruation with irregular cycle: Secondary | ICD-10-CM | POA: Diagnosis not present

## 2019-07-13 LAB — POCT URINE PREGNANCY: Preg Test, Ur: NEGATIVE

## 2019-07-13 MED ORDER — FLUOXETINE HCL 20 MG PO TABS: 20.0000 mg | ORAL_TABLET | Freq: Every day | ORAL | 3 refills | Status: DC

## 2019-07-13 MED ORDER — NORETHIN ACE-ETH ESTRAD-FE 1-20 MG-MCG PO TABS: 1.0000 | ORAL_TABLET | Freq: Every day | ORAL | 3 refills | Status: DC

## 2019-07-13 NOTE — Progress Notes (Signed)
33 y.o. G0P0000 Single White or Caucasian Not Hispanic or Latino female here for annual exam and to discuss irregular menses with OCP. She has a h/o a hysteroscopy and cervical myomectomy in 9/19. Prior to her surgery she was bleeding almost non stop. She can skip a cycle, or she can have light bleeding for up to 14 days (usually the week prior to placebo and during placebo). No late or missed pills. This has been going on for months. Prior to being on OCP's cycles were regular but heavy. She is sexually active, commonly spots after intercourse, only if she is about to start a cycle. Same partner x over one year. No dyspareunia.   Period Duration (Days): 14 days Period Pattern: (!) Irregular Menstrual Flow: Moderate, Light Menstrual Control: Thin pad, Tampon Menstrual Control Change Freq (Hours): changes tampon/pad every 6 hours Dysmenorrhea: None  No LMP recorded (lmp unknown). (Menstrual status: Oral contraceptives).          Sexually active: Yes.    The current method of family planning is OCP (estrogen/progesterone).    Exercising: Yes.    running, burn [employer] camp Smoker:  no  Health Maintenance: Pap: 05-05-18 Neg; 2013 LGSIL History of abnormal Pap:  Yes, 2013 LGSIL--pt. Never followed up MMG:  n/a Colonoscopy:  n/a BMD:   n/a TDaP:  Unsure Gardasil: completed   reports that she has never smoked. She has never used smokeless tobacco. She reports current alcohol use of about 5.0 standard drinks of alcohol per week. She reports that she does not use drugs. She works in Pharmacologist for the [employer]   Past Medical History:  Diagnosis Date  . ADD (attention deficit disorder)   . Anemia   . Anxiety   . Asthma    grew out of it, childhood  . Depression   . Fatigue   . Fibroid of cervix   . Plantar wart of left foot 04/14/2014  . Seasonal allergies   . Syncope, vasovagal     Past Surgical History:  Procedure Laterality Date  . HYSTEROSCOPY W/D&C N/A 08/12/2018    Procedure: DILATATION AND CURETTAGE /HYSTEROSCOPY;  Surgeon: Salvadore Dom, MD;  Location: [facility];  Service: Gynecology;  Laterality: N/A;  . NASAL SEPTUM SURGERY    . PROLAPSED UTERINE FIBROID LIGATION N/A 08/12/2018   Procedure: MYOMECTOMY CERVICAL;  Surgeon: Salvadore Dom, MD;  Location: [facility];  Service: Gynecology;  Laterality: N/A;  . RECONSTRUCTION MANDIBLE / MAXILLA     underbite correction    Current Outpatient Medications  Medication Sig Dispense Refill  . FLUoxetine (PROZAC) 20 MG tablet Take 1 tablet (20 mg total) by mouth daily. 90 tablet 0  . ibuprofen (ADVIL,MOTRIN) 600 MG tablet Take 600 mg by mouth every 6 (six) hours as needed.    Lenda Kelp FE 1/20 1-20 MG-MCG tablet TAKE 1 TABLET BY MOUTH EVERY DAY 28 tablet 5  . VENTOLIN HFA 108 (90 Base) MCG/ACT inhaler INHALE 1-2 PUFFS INTO THE LUNGS EVERY 4 (FOUR) HOURS AS NEEDED FOR WHEEZING OR SHORTNESS OF BREATH. 18 Inhaler 0   No current facility-administered medications for this visit.     Family History  Problem Relation Age of Onset  . Cancer Father        prostate  . Heart disease Maternal Grandmother   . Hyperlipidemia Maternal Grandmother   . Stroke Maternal Grandmother   . Hypertension Maternal Grandmother     Review of Systems  Constitutional: Positive for fatigue.  HENT: Negative.   Eyes: Negative.   Respiratory: Negative.   Cardiovascular: Negative.   Gastrointestinal: Negative.   Endocrine: Negative.   Genitourinary: Positive for menstrual problem.  Musculoskeletal: Negative.   Skin: Negative.   Allergic/Immunologic: Negative.   Neurological: Negative.   Hematological: Negative.   Psychiatric/Behavioral: Negative.     Exam:   BP 120/84 (BP Location: Right Arm, Patient Position: Sitting, Cuff Size: Normal)   Pulse 60   Temp 98.6 F (37 C) (Skin)   Ht 5\' 3"  (1.6 m)   Wt 160 lb 9.6 oz (72.8 kg)   LMP  (LMP Unknown)   BMI 28.45 kg/m   Weight  change: @WEIGHTCHANGE @ Height:   Height: 5\' 3"  (160 cm)  Ht Readings from Last 3 Encounters:  07/13/19 5\' 3"  (1.6 m)  01/01/19 5\' 3"  (1.6 m)  08/12/18 5\' 3"  (1.6 m)    General appearance: alert, cooperative and appears stated age Head: Normocephalic, without obvious abnormality, atraumatic Neck: no adenopathy, supple, symmetrical, trachea midline and thyroid normal to inspection and palpation Lungs: clear to auscultation bilaterally Cardiovascular: regular rate and rhythm Breasts: normal appearance, no masses or tenderness Abdomen: soft, non-tender; non distended,  no masses,  no organomegaly Extremities: extremities normal, atraumatic, no cyanosis or edema Skin: Skin color, texture, turgor normal. No rashes or lesions Lymph nodes: Cervical, supraclavicular, and axillary nodes normal. No abnormal inguinal nodes palpated Neurologic: Grossly normal   Pelvic: External genitalia:  no lesions              Urethra:  normal appearing urethra with no masses, tenderness or lesions              Bartholins and Skenes: normal                 Vagina: normal appearing vagina with normal color and discharge, no lesions              Cervix: no lesions               Bimanual Exam:  Uterus:  normal size, contour, position, consistency, mobility, non-tender, anteverted              Adnexa: no mass, fullness, tenderness               Rectovaginal: Confirms               Anus:  normal sphincter tone, no lesions  Chaperone was present for exam.  A:  Well Woman with normal exam  BTB on OCP's, can skip a month then have bleeding for 2 weeks  P:   No pap this year  Declines std testing  UPT negative  Discussed option of changing her pills or trying an IUD  She is interested in an IUD, information given on [place] and Mirena  Will refill her pills for now, she will consider options  Discussed breast self exam  Discussed calcium and vit D intake  Screening labs

## 2019-07-13 NOTE — Patient Instructions (Signed)
EXERCISE AND DIET:  We recommended that you start or continue a regular exercise program for good health. Regular exercise means any activity that makes your heart beat faster and makes you sweat.  We recommend exercising at least 30 minutes per day at least 3 days a week, preferably 4 or 5.  We also recommend a diet low in fat and sugar.  Inactivity, poor dietary choices and obesity can cause diabetes, heart attack, stroke, and kidney damage, among others.    ALCOHOL AND SMOKING:  Women should limit their alcohol intake to no more than 7 drinks/beers/glasses of wine (combined, not each!) per week. Moderation of alcohol intake to this level decreases your risk of breast cancer and liver damage. And of course, no recreational drugs are part of a healthy lifestyle.  And absolutely no smoking or even second hand smoke. Most people know smoking can cause heart and lung diseases, but did you know it also contributes to weakening of your bones? Aging of your skin?  Yellowing of your teeth and nails?  CALCIUM AND VITAMIN D:  Adequate intake of calcium and Vitamin D are recommended.  The recommendations for exact amounts of these supplements seem to change often, but generally speaking 1,000 mg of calcium (between diet and supplement) and 800 units of Vitamin D per day seems prudent. Certain women may benefit from higher intake of Vitamin D.  If you are among these women, your doctor will have told you during your visit.    PAP SMEARS:  Pap smears, to check for cervical cancer or precancers,  have traditionally been done yearly, although recent scientific advances have shown that most women can have pap smears less often.  However, every woman still should have a physical exam from her gynecologist every year. It will include a breast check, inspection of the vulva and vagina to check for abnormal growths or skin changes, a visual exam of the cervix, and then an exam to evaluate the size and shape of the uterus and  ovaries.  And after 31 years of age, a rectal exam is indicated to check for rectal cancers. We will also provide age appropriate advice regarding health maintenance, like when you should have certain vaccines, screening for sexually transmitted diseases, bone density testing, colonoscopy, mammograms, etc.   MAMMOGRAMS:  All women over 40 years old should have a yearly mammogram. Many facilities now offer a "3D" mammogram, which may cost around $50 extra out of pocket. If possible,  we recommend you accept the option to have the 3D mammogram performed.  It both reduces the number of women who will be called back for extra views which then turn out to be normal, and it is better than the routine mammogram at detecting truly abnormal areas.    COLON CANCER SCREENING: Now recommend starting at age 45. At this time colonoscopy is not covered for routine screening until 50. There are take home tests that can be done between 45-49.   COLONOSCOPY:  Colonoscopy to screen for colon cancer is recommended for all women at age 50.  We know, you hate the idea of the prep.  We agree, BUT, having colon cancer and not knowing it is worse!!  Colon cancer so often starts as a polyp that can be seen and removed at colonscopy, which can quite literally save your life!  And if your first colonoscopy is normal and you have no family history of colon cancer, most women don't have to have it again for   10 years.  Once every ten years, you can do something that may end up saving your life, right?  We will be happy to help you get it scheduled when you are ready.  Be sure to check your insurance coverage so you understand how much it will cost.  It may be covered as a preventative service at no cost, but you should check your particular policy.      Breast Self-Awareness Breast self-awareness means being familiar with how your breasts look and feel. It involves checking your breasts regularly and reporting any changes to your  health care provider. Practicing breast self-awareness is important. A change in your breasts can be a sign of a serious medical problem. Being familiar with how your breasts look and feel allows you to find any problems early, when treatment is more likely to be successful. All women should practice breast self-awareness, including women who have had breast implants. How to do a breast self-exam One way to learn what is normal for your breasts and whether your breasts are changing is to do a breast self-exam. To do a breast self-exam: Look for Changes  1. Remove all the clothing above your waist. 2. Stand in front of a mirror in a room with good lighting. 3. Put your hands on your hips. 4. Push your hands firmly downward. 5. Compare your breasts in the mirror. Look for differences between them (asymmetry), such as: ? Differences in shape. ? Differences in size. ? Puckers, dips, and bumps in one breast and not the other. 6. Look at each breast for changes in your skin, such as: ? Redness. ? Scaly areas. 7. Look for changes in your nipples, such as: ? Discharge. ? Bleeding. ? Dimpling. ? Redness. ? A change in position. Feel for Changes Carefully feel your breasts for lumps and changes. It is best to do this while lying on your back on the floor and again while sitting or standing in the shower or tub with soapy water on your skin. Feel each breast in the following way:  Place the arm on the side of the breast you are examining above your head.  Feel your breast with the other hand.  Start in the nipple area and make  inch (2 cm) overlapping circles to feel your breast. Use the pads of your three middle fingers to do this. Apply light pressure, then medium pressure, then firm pressure. The light pressure will allow you to feel the tissue closest to the skin. The medium pressure will allow you to feel the tissue that is a little deeper. The firm pressure will allow you to feel the tissue  close to the ribs.  Continue the overlapping circles, moving downward over the breast until you feel your ribs below your breast.  Move one finger-width toward the center of the body. Continue to use the  inch (2 cm) overlapping circles to feel your breast as you move slowly up toward your collarbone.  Continue the up and down exam using all three pressures until you reach your armpit.  Write Down What You Find  Write down what is normal for each breast and any changes that you find. Keep a written record with breast changes or normal findings for each breast. By writing this information down, you do not need to depend only on memory for size, tenderness, or location. Write down where you are in your menstrual cycle, if you are still menstruating. If you are having trouble noticing differences   in your breasts, do not get discouraged. With time you will become more familiar with the variations in your breasts and more comfortable with the exam. How often should I examine my breasts? Examine your breasts every month. If you are breastfeeding, the best time to examine your breasts is after a feeding or after using a breast pump. If you menstruate, the best time to examine your breasts is 5-7 days after your period is over. During your period, your breasts are lumpier, and it may be more difficult to notice changes. When should I see my health care provider? See your health care provider if you notice:  A change in shape or size of your breasts or nipples.  A change in the skin of your breast or nipples, such as a reddened or scaly area.  Unusual discharge from your nipples.  A lump or thick area that was not there before.  Pain in your breasts.  Anything that concerns you.  

## 2019-07-14 LAB — CBC
Hematocrit: 39.5 % (ref 34.0–46.6)
Hemoglobin: 12.8 g/dL (ref 11.1–15.9)
MCH: 29.2 pg (ref 26.6–33.0)
MCHC: 32.4 g/dL (ref 31.5–35.7)
MCV: 90 fL (ref 79–97)
Platelets: 163 10*3/uL (ref 150–450)
RBC: 4.39 x10E6/uL (ref 3.77–5.28)
RDW: 13.9 % (ref 11.7–15.4)
WBC: 5.4 10*3/uL (ref 3.4–10.8)

## 2019-07-14 LAB — COMPREHENSIVE METABOLIC PANEL
ALT: 16 IU/L (ref 0–32)
AST: 19 IU/L (ref 0–40)
Albumin/Globulin Ratio: 1.9 (ref 1.2–2.2)
Albumin: 4.3 g/dL (ref 3.9–5.0)
Alkaline Phosphatase: 42 IU/L (ref 39–117)
BUN/Creatinine Ratio: 14 (ref 9–23)
BUN: 13 mg/dL (ref 6–20)
Bilirubin Total: 0.5 mg/dL (ref 0.0–1.2)
CO2: 23 mmol/L (ref 20–29)
Calcium: 8.8 mg/dL (ref 8.7–10.2)
Chloride: 106 mmol/L (ref 96–106)
Creatinine, Ser: 0.9 mg/dL (ref 0.57–1.00)
GFR calc Af Amer: 99 mL/min/{1.73_m2} (ref >59)
GFR calc non Af Amer: 86 mL/min/{1.73_m2} (ref >59)
Globulin, Total: 2.3 g/dL (ref 1.5–4.5)
Glucose: 89 mg/dL (ref 65–99)
Potassium: 4.3 mmol/L (ref 3.5–5.2)
Sodium: 142 mmol/L (ref 134–144)
Total Protein: 6.6 g/dL (ref 6.0–8.5)

## 2019-07-14 LAB — LIPID PANEL
Chol/HDL Ratio: 2.2 ratio (ref 0.0–4.4)
Cholesterol, Total: 145 mg/dL (ref 100–199)
HDL: 65 mg/dL (ref >39)
LDL Calculated: 64 mg/dL (ref 0–99)
Triglycerides: 79 mg/dL (ref 0–149)
VLDL Cholesterol Cal: 16 mg/dL (ref 5–40)

## 2019-07-15 ENCOUNTER — Telehealth: Payer: Self-pay | Admitting: Obstetrics and Gynecology

## 2019-07-15 DIAGNOSIS — Z30430 Encounter for insertion of intrauterine contraceptive device: Secondary | ICD-10-CM

## 2019-07-15 NOTE — Telephone Encounter (Signed)
Spoke with patient. Patient is taking [facility] OCP. Would like to schedule Mirena IUD insertion. Appointment scheduled for 07/21/2019 at 1:30 pm with Dr.Jertson. Pre procedure instructions given.  Motrin instructions given. Motrin=Advil=Ibuprofen, 800 mg one hour before appointment. Eat a meal and hydrate well before appointment. Patient verbalizes understanding. Order placed. Encounter closed.

## 2019-07-15 NOTE — Telephone Encounter (Signed)
Left message to call Kaitlyn at [phone]. 

## 2019-07-15 NOTE — Telephone Encounter (Signed)
Patient would like an appointment for iud insertion.

## 2019-07-17 ENCOUNTER — Other Ambulatory Visit: Payer: Self-pay

## 2019-07-17 ENCOUNTER — Telehealth: Payer: Self-pay | Admitting: Obstetrics and Gynecology

## 2019-07-17 NOTE — Telephone Encounter (Signed)
Call placed to convey benefits for Mirena insertion. Spoke with patient she understands/agreeable with the benefits. Appointment scheduled 07/21/19.

## 2019-07-20 NOTE — Progress Notes (Signed)
GYNECOLOGY  VISIT   HPI: 33 y.o.   Single White or Caucasian Not Hispanic or Latino  female   G0P0000 with No LMP recorded (lmp unknown). (Menstrual status: Oral contraceptives).   here for Mirena IUD insertion. No complaints.    GYNECOLOGIC HISTORY: No LMP recorded (lmp unknown). (Menstrual status: Oral contraceptives). Contraception:OCP Menopausal hormone therapy: None        OB History    Gravida  0   Para  0   Term  0   Preterm  0   AB  0   Living  0     SAB  0   TAB  0   Ectopic  0   Multiple  0   Live Births                 Patient Active Problem List   Diagnosis Date Noted  . Iron deficiency anemia 09/01/2012  . Exercise-induced asthma 09/01/2012  . Depression   . ADD (attention deficit disorder)     Past Medical History:  Diagnosis Date  . ADD (attention deficit disorder)   . Anemia   . Anxiety   . Asthma    grew out of it, childhood  . Depression   . Fatigue   . Fibroid of cervix   . Plantar wart of left foot 04/14/2014  . Seasonal allergies   . Syncope, vasovagal     Past Surgical History:  Procedure Laterality Date  . HYSTEROSCOPY W/D&C N/A 08/12/2018   Procedure: DILATATION AND CURETTAGE /HYSTEROSCOPY;  Surgeon: Salvadore Dom, MD;  Location: [facility];  Service: Gynecology;  Laterality: N/A;  . NASAL SEPTUM SURGERY    . PROLAPSED UTERINE FIBROID LIGATION N/A 08/12/2018   Procedure: MYOMECTOMY CERVICAL;  Surgeon: Salvadore Dom, MD;  Location: [facility];  Service: Gynecology;  Laterality: N/A;  . RECONSTRUCTION MANDIBLE / MAXILLA     underbite correction    Current Outpatient Medications  Medication Sig Dispense Refill  . FLUoxetine (PROZAC) 20 MG tablet Take 1 tablet (20 mg total) by mouth daily. 90 tablet 3  . ibuprofen (ADVIL,MOTRIN) 600 MG tablet Take 600 mg by mouth every 6 (six) hours as needed.    . norethindrone-ethinyl estradiol (JUNEL FE 1/20) 1-20 MG-MCG tablet Take 1  tablet by mouth daily. 84 tablet 3  . VENTOLIN HFA 108 (90 Base) MCG/ACT inhaler INHALE 1-2 PUFFS INTO THE LUNGS EVERY 4 (FOUR) HOURS AS NEEDED FOR WHEEZING OR SHORTNESS OF BREATH. 18 Inhaler 0   No current facility-administered medications for this visit.      ALLERGIES: Patient has no known allergies.  Family History  Problem Relation Age of Onset  . Cancer Father        prostate  . Heart disease Maternal Grandmother   . Hyperlipidemia Maternal Grandmother   . Stroke Maternal Grandmother   . Hypertension Maternal Grandmother     Social History   Socioeconomic History  . Marital status: Single    Spouse name: n/a  . Number of children: 0  . Years of education: 15+  . Highest education level: Not on file  Occupational History  . 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 Financial resource strain: Not on file  . Food insecurity    Worry: Not on file    Inability: Not on file  . Transportation needs    Medical: Not on file  Non-medical: Not on file  Tobacco Use  . Smoking status: Never Smoker  . Smokeless tobacco: Never Used  Substance and Sexual Activity  . Alcohol use: Yes    Alcohol/week: 5.0 standard drinks    Types: 5 Cans of beer per week  . Drug use: No  . Sexual activity: Yes    Partners: Male    Birth control/protection: Pill  Lifestyle  . Physical activity    Days per week: Not on file    Minutes per session: Not on file  . Stress: Not on file  Relationships  . Social Herbalist on phone: Not on file    Gets together: Not on file    Attends religious service: Not on file    Active member of club or organization: Not on file    Attends meetings of clubs or organizations: Not on file    Relationship status: Not on file  . Intimate partner violence    Fear of current or ex partner: Not on file    Emotionally abused: Not on file    Physically abused: Not on file     Forced sexual activity: Not on file  Other Topics Concern  . Not on file  Social History Narrative   Lives with 2 sisters (and soon-to-be brother-in-law) in the house next-door to their parents.    Review of Systems  Constitutional: Negative.   HENT: Negative.   Eyes: Negative.   Respiratory: Negative.   Cardiovascular: Negative.   Gastrointestinal: Negative.   Genitourinary: Negative.   Musculoskeletal: Negative.   Skin: Negative.   Neurological: Negative.   Endo/Heme/Allergies: Negative.   Psychiatric/Behavioral: Negative.     PHYSICAL EXAMINATION:    BP 124/84 (BP Location: Right Arm, Patient Position: Sitting, Cuff Size: Normal)   Pulse 68   Temp 98.1 F (36.7 C) (Skin)   Wt 158 lb 3.2 oz (71.8 kg)   LMP  (LMP Unknown)   BMI 28.02 kg/m     General appearance: alert, cooperative and appears stated age  Pelvic: External genitalia:  no lesions              Urethra:  normal appearing urethra with no masses, tenderness or lesions              Bartholins and Skenes: normal                 Vagina: normal appearing vagina with normal color and discharge, no lesions              Cervix:  no lesions   The risks of the mirena IUD were reviewed with the patient, including infection, abnormal bleeding and uterine perfortion. Consent was signed.  A speculum was placed in the vagina, the cervix was cleansed with betadine. A tenaculum was placed on the cervix, the uterus sounded to ~ 7 cm. The cervix was dilated to a 5 hagar dilator. The cervix was tight.   The mirena IUD was inserted. The string were cut to 3-4 cm. The tenaculum was removed. Slight oozing from the tenaculum site was stopped with pressure.  The patient had a vasovagal episode after the speculum was removed, responded to smelling salts. Pulse was ~60 after the event. She was feeling better, pain down to 4/10.      Chaperone was present for exam.  ASSESSMENT Mirena IUD insertion Cervical stenosis, difficult  time with dilation Vasovagal episode, watched patient for over 30 minutes after syncope. Given food and  water.     PLAN Will check ultrasound to confirm correct IUD placement genprobe done   An After Visit Summary was printed and given to the patient.  Addendum: ultrasound done,  IUD in place. Patient feeling much better.  F/U in one month, call with any concerns. Ultrasound images reviewed with the patient.

## 2019-07-21 ENCOUNTER — Ambulatory Visit (INDEPENDENT_AMBULATORY_CARE_PROVIDER_SITE_OTHER): Payer: 59

## 2019-07-21 ENCOUNTER — Other Ambulatory Visit: Payer: Self-pay

## 2019-07-21 ENCOUNTER — Ambulatory Visit (INDEPENDENT_AMBULATORY_CARE_PROVIDER_SITE_OTHER): Payer: 59 | Admitting: Obstetrics and Gynecology

## 2019-07-21 ENCOUNTER — Encounter: Payer: Self-pay | Admitting: Obstetrics and Gynecology

## 2019-07-21 VITALS — BP 124/84 | HR 68 | Temp 98.1°F | Wt 158.2 lb

## 2019-07-21 DIAGNOSIS — Z30430 Encounter for insertion of intrauterine contraceptive device: Secondary | ICD-10-CM | POA: Diagnosis not present

## 2019-07-21 DIAGNOSIS — N882 Stricture and stenosis of cervix uteri: Secondary | ICD-10-CM | POA: Diagnosis not present

## 2019-07-21 DIAGNOSIS — Z01812 Encounter for preprocedural laboratory examination: Secondary | ICD-10-CM

## 2019-07-21 DIAGNOSIS — R55 Syncope and collapse: Secondary | ICD-10-CM | POA: Diagnosis not present

## 2019-07-21 LAB — POCT URINE PREGNANCY: Preg Test, Ur: NEGATIVE

## 2019-07-21 NOTE — Patient Instructions (Signed)
IUD Post-procedure Instructions . Cramping is common.  You may take Ibuprofen, Aleve, or Tylenol for the cramping.  This should resolve within 24 hours.   . You may have a small amount of spotting.  You should wear a mini pad for the next few days. . You may have intercourse in 24 hours. . You need to call the office if you have any pelvic pain, fever, heavy bleeding, or foul smelling vaginal discharge. . Shower or bathe as normal . Use a back up form of contraception for one week

## 2019-07-22 LAB — GC/CHLAMYDIA PROBE AMP
Chlamydia trachomatis, NAA: NEGATIVE
Neisseria Gonorrhoeae by PCR: NEGATIVE

## 2019-08-17 NOTE — Progress Notes (Signed)
GYNECOLOGY  VISIT   HPI: 33 y.o.   Single White or Caucasian Not Hispanic or Latino  female   G0P0000 with Patient's last menstrual period was 07/21/2019.   here for 4 week recheck, she had a mirena IUD inserted last month. She had a vagal reaction after IUD insertion. IUD in place on ultrasound. She spotted and had cramping for a week after the IUD was inserted. Fine since then. Sexually active, no pain.     GYNECOLOGIC HISTORY: Patient's last menstrual period was 07/21/2019. Contraception:Mirena IUD Menopausal hormone therapy: n/a        OB History    Gravida  0   Para  0   Term  0   Preterm  0   AB  0   Living  0     SAB  0   TAB  0   Ectopic  0   Multiple  0   Live Births                 Patient Active Problem List   Diagnosis Date Noted  . Iron deficiency anemia 09/01/2012  . Exercise-induced asthma 09/01/2012  . Depression   . ADD (attention deficit disorder)     Past Medical History:  Diagnosis Date  . ADD (attention deficit disorder)   . Anemia   . Anxiety   . Asthma    grew out of it, childhood  . Depression   . Fatigue   . Fibroid of cervix   . Plantar wart of left foot 04/14/2014  . Seasonal allergies   . Syncope, vasovagal     Past Surgical History:  Procedure Laterality Date  . HYSTEROSCOPY W/D&C N/A 08/12/2018   Procedure: DILATATION AND CURETTAGE /HYSTEROSCOPY;  Surgeon: Salvadore Dom, MD;  Location: [facility];  Service: Gynecology;  Laterality: N/A;  . NASAL SEPTUM SURGERY    . PROLAPSED UTERINE FIBROID LIGATION N/A 08/12/2018   Procedure: MYOMECTOMY CERVICAL;  Surgeon: Salvadore Dom, MD;  Location: [facility];  Service: Gynecology;  Laterality: N/A;  . RECONSTRUCTION MANDIBLE / MAXILLA     underbite correction    Current Outpatient Medications  Medication Sig Dispense Refill  . FLUoxetine (PROZAC) 20 MG tablet Take 1 tablet (20 mg total) by mouth daily. 90 tablet 3  . VENTOLIN  HFA 108 (90 Base) MCG/ACT inhaler INHALE 1-2 PUFFS INTO THE LUNGS EVERY 4 (FOUR) HOURS AS NEEDED FOR WHEEZING OR SHORTNESS OF BREATH. 18 Inhaler 0   No current facility-administered medications for this visit.      ALLERGIES: Patient has no known allergies.  Family History  Problem Relation Age of Onset  . Cancer Father        prostate  . Heart disease Maternal Grandmother   . Hyperlipidemia Maternal Grandmother   . Stroke Maternal Grandmother   . Hypertension Maternal Grandmother     Social History   Socioeconomic History  . Marital status: Single    Spouse name: n/a  . Number of children: 0  . Years of education: 15+  . Highest education level: Not on file  Occupational History  . 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 Financial resource strain: Not on file  . Food insecurity    Worry: Not on file    Inability: Not on file  . Transportation needs    Medical: Not on file    Non-medical: Not  on file  Tobacco Use  . Smoking status: Never Smoker  . Smokeless tobacco: Never Used  Substance and Sexual Activity  . Alcohol use: Yes    Alcohol/week: 5.0 standard drinks    Types: 5 Cans of beer per week  . Drug use: No  . Sexual activity: Yes    Partners: Male    Birth control/protection: Pill  Lifestyle  . Physical activity    Days per week: Not on file    Minutes per session: Not on file  . Stress: Not on file  Relationships  . Social Herbalist on phone: Not on file    Gets together: Not on file    Attends religious service: Not on file    Active member of club or organization: Not on file    Attends meetings of clubs or organizations: Not on file    Relationship status: Not on file  . Intimate partner violence    Fear of current or ex partner: Not on file    Emotionally abused: Not on file    Physically abused: Not on file    Forced sexual activity: Not on file  Other Topics  Concern  . Not on file  Social History Narrative   Lives with 2 sisters (and soon-to-be brother-in-law) in the house next-door to their parents.    Review of Systems  Constitutional: Negative.   HENT: Negative.   Eyes: Negative.   Respiratory: Negative.   Cardiovascular: Negative.   Gastrointestinal: Negative.   Genitourinary: Negative.   Musculoskeletal: Negative.   Skin: Negative.   Neurological: Negative.   Endo/Heme/Allergies: Negative.   Psychiatric/Behavioral: Negative.     PHYSICAL EXAMINATION:    BP 122/68 (BP Location: Right Arm, Patient Position: Sitting, Cuff Size: Large)   Pulse 72   Temp 97.8 F (36.6 C) (Temporal)   Resp 12   Wt 159 lb (72.1 kg)   LMP 07/21/2019   BMI 28.17 kg/m     General appearance: alert, cooperative and appears stated age  Pelvic: External genitalia:  no lesions              Urethra:  normal appearing urethra with no masses, tenderness or lesions              Bartholins and Skenes: normal                 Vagina: normal appearing vagina with a slight increase in watery white/pink vaginal d/c (denies symptoms)              Cervix: no cervical motion tenderness, no lesions and IUD strings 3-4 cm              Bimanual Exam:  Uterus:  normal size, contour, position, consistency, mobility, non-tender              Adnexa: no mass, fullness, tenderness                Chaperone was present for exam.    ASSESSMENT IUD check, doing well    PLAN Routine f/u   An After Visit Summary was printed and given to the patient.

## 2019-08-18 ENCOUNTER — Other Ambulatory Visit: Payer: Self-pay

## 2019-08-19 ENCOUNTER — Ambulatory Visit (INDEPENDENT_AMBULATORY_CARE_PROVIDER_SITE_OTHER): Payer: 59 | Admitting: Obstetrics and Gynecology

## 2019-08-19 ENCOUNTER — Encounter: Payer: Self-pay | Admitting: Obstetrics and Gynecology

## 2019-08-19 VITALS — BP 122/68 | HR 72 | Temp 97.8°F | Resp 12 | Wt 159.0 lb

## 2019-08-19 DIAGNOSIS — Z30431 Encounter for routine checking of intrauterine contraceptive device: Secondary | ICD-10-CM | POA: Diagnosis not present

## 2019-11-05 ENCOUNTER — Telehealth: Payer: Self-pay | Admitting: Obstetrics and Gynecology

## 2019-11-05 DIAGNOSIS — R102 Pelvic and perineal pain: Secondary | ICD-10-CM

## 2019-11-05 DIAGNOSIS — Z30431 Encounter for routine checking of intrauterine contraceptive device: Secondary | ICD-10-CM

## 2019-11-05 NOTE — Telephone Encounter (Signed)
Spoke with patient. Patient reports pain with intercourse since Mirena IUD was placed 07/2019. No partner change, partner reports intercourse just "feels different". No menses since IUD placed, occasional spotting. Denies vaginal d/c, odor, fever/chills, N/V or pelvic pain. IUD was placed with US guidance.   Recommended further evaluation, patient scheduled for PUS on 12/8 at 2pm, consult at 2:30pm with Dr. Talbert Nan. Advised patient I will review with Dr. Talbert Nan and return call if only OV recommended. Patient verbalizes understanding.   Dr. Talbert Nan -please review, ok to proceed as scheduled?

## 2019-11-05 NOTE — Telephone Encounter (Signed)
I agree with the plan! Thank you

## 2019-11-05 NOTE — Telephone Encounter (Signed)
Patient had IUD placed 07/21/19. Lately has been having pain with intercourse. Unsure if related to IUD.

## 2019-11-05 NOTE — Telephone Encounter (Signed)
Order placed for PUS.   Routing to [employer] and [employer]  for [employer].   Encounter closed.

## 2019-11-09 ENCOUNTER — Other Ambulatory Visit: Payer: Self-pay

## 2019-11-09 NOTE — Progress Notes (Signed)
GYNECOLOGY  VISIT   HPI: 33 y.o.   Single White or Caucasian Not Hispanic or Latino  female   [id number] with No LMP recorded. (Menstrual status: IUD).   here for  Evaluation of dyspareunia starting after her mirena IUD was placed in 8/20.  She notes feeling uncomfortable every time she is having sex, tender on entry and sore afterwards. The deep pain only started last week, occurred x 1. She also notes a 1 week h/o intermittent discomfort in the RLQ, worse with a full bladder. No urinary frequency, urgency or dysuria.  Same partner x 1.5 years. They stopped using condoms when she got the IUD.  No menses, just occasional spotting since the IUD was placed.   Prior to the IUD she was having irregular menses with OCP's. Prior h/o hysteroscopy and cervical myomectomy in 9/19.  GYNECOLOGIC HISTORY: No LMP recorded. (Menstrual status: IUD). Contraception:Mirena IUD 08/19/19 Menopausal hormone therapy: none        OB History    Gravida  0   Para  0   Term  0   Preterm  0   AB  0   Living  0     SAB  0   TAB  0   Ectopic  0   Multiple  0   Live Births                 Patient Active Problem List   Diagnosis Date Noted  . Iron deficiency anemia 09/01/2012  . Exercise-induced asthma 09/01/2012  . Depression   . ADD (attention deficit disorder)     Past Medical History:  Diagnosis Date  . ADD (attention deficit disorder)   . Anemia   . Anxiety   . Asthma    grew out of it, childhood  . Depression   . Fatigue   . Fibroid of cervix   . Plantar wart of left foot 04/14/2014  . Seasonal allergies   . Syncope, vasovagal     Past Surgical History:  Procedure Laterality Date  . HYSTEROSCOPY W/D&C N/A 08/12/2018   Procedure: DILATATION AND CURETTAGE /HYSTEROSCOPY;  Surgeon: Salvadore Dom, MD;  Location: [facility];  Service: Gynecology;  Laterality: N/A;  . NASAL SEPTUM SURGERY    . PROLAPSED UTERINE FIBROID LIGATION N/A 08/12/2018   Procedure:  MYOMECTOMY CERVICAL;  Surgeon: Salvadore Dom, MD;  Location: [facility];  Service: Gynecology;  Laterality: N/A;  . RECONSTRUCTION MANDIBLE / MAXILLA     underbite correction    Current Outpatient Medications  Medication Sig Dispense Refill  . FLUoxetine (PROZAC) 20 MG tablet Take 1 tablet (20 mg total) by mouth daily. 90 tablet 3  . levonorgestrel (MIRENA) 20 MCG/24HR IUD 1 each by Intrauterine route once.    . VENTOLIN HFA 108 (90 Base) MCG/ACT inhaler INHALE 1-2 PUFFS INTO THE LUNGS EVERY 4 (FOUR) HOURS AS NEEDED FOR WHEEZING OR SHORTNESS OF BREATH. 18 Inhaler 0   No current facility-administered medications for this visit.      ALLERGIES: Patient has no known allergies.  Family History  Problem Relation Age of Onset  . Cancer Father        prostate  . Heart disease Maternal Grandmother   . Hyperlipidemia Maternal Grandmother   . Stroke Maternal Grandmother   . Hypertension Maternal Grandmother     Social History   Socioeconomic History  . Marital status: Single    Spouse name: n/a  . Number of children: 0  .  Years of education: 15+  . Highest education level: Not on file  Occupational History  . 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 Financial resource strain: Not on file  . Food insecurity    Worry: Not on file    Inability: Not on file  . Transportation needs    Medical: Not on file    Non-medical: Not on file  Tobacco Use  . Smoking status: Never Smoker  . Smokeless tobacco: Never Used  Substance and Sexual Activity  . Alcohol use: Yes    Alcohol/week: 5.0 standard drinks    Types: 5 Cans of beer per week  . Drug use: No  . Sexual activity: Yes    Partners: Male    Birth control/protection: I.U.D.    Comment: Mirena inserted 07/21/19  Lifestyle  . Physical activity    Days per week: Not on file    Minutes per session: Not on file  . Stress: Not on file   Relationships  . Social Herbalist on phone: Not on file    Gets together: Not on file    Attends religious service: Not on file    Active member of club or organization: Not on file    Attends meetings of clubs or organizations: Not on file    Relationship status: Not on file  . Intimate partner violence    Fear of current or ex partner: Not on file    Emotionally abused: Not on file    Physically abused: Not on file    Forced sexual activity: Not on file  Other Topics Concern  . Not on file  Social History Narrative   Lives with 2 sisters (and soon-to-be brother-in-law) in the house next-door to their parents.    Review of Systems  Constitutional: Negative.   HENT: Negative.   Eyes: Negative.   Respiratory: Negative.   Cardiovascular: Negative.   Gastrointestinal: Negative.   Genitourinary:       Dyspareunia  Musculoskeletal: Negative.   Skin: Negative.   Neurological: Negative.   Endo/Heme/Allergies: Negative.   Psychiatric/Behavioral: Negative.     PHYSICAL EXAMINATION:    BP 114/80 (BP Location: Right Arm, Patient Position: Sitting, Cuff Size: Large)   Pulse 72   Temp (!) 97.2 F (36.2 C) (Skin)   Wt 159 lb (72.1 kg)   BMI 28.17 kg/m     General appearance: alert, cooperative and appears stated age Abdomen: soft, minimally tender in the RLQ, no rebound, no guarding; non distended, no masses,  no organomegaly  Ultrasound images reviewed with the patient. 3.6 cm simple left ovarian cyst. IUD in place. Normal uterus and right adnexa, thin endometrium.   ASSESSMENT Deep dyspareunia x 1 week Left ovarian cyst on ultrasound, tender with palpation of the left adnexa with the ultrasound Entry dyspareunia, c/w lack of lubrication Mild RLQ discomfort for the last week    PLAN Patient reassured that the cyst appears physiologic and it should resolve Recommended she use lubrication for entry dyspareunia (reviewed some options), call if not improved She  should control rate and depth of penetration with intercourse She can take ibuprofen or tylenol for mild discomfort   An After Visit Summary was printed and given to the patient.

## 2019-11-10 ENCOUNTER — Encounter: Payer: Self-pay | Admitting: Obstetrics and Gynecology

## 2019-11-10 ENCOUNTER — Ambulatory Visit: Payer: 59 | Admitting: Obstetrics and Gynecology

## 2019-11-10 ENCOUNTER — Ambulatory Visit (INDEPENDENT_AMBULATORY_CARE_PROVIDER_SITE_OTHER): Payer: 59

## 2019-11-10 VITALS — BP 114/80 | HR 72 | Temp 97.2°F | Wt 159.0 lb

## 2019-11-10 DIAGNOSIS — N9411 Superficial (introital) dyspareunia: Secondary | ICD-10-CM

## 2019-11-10 DIAGNOSIS — R102 Pelvic and perineal pain: Secondary | ICD-10-CM

## 2019-11-10 DIAGNOSIS — N9412 Deep dyspareunia: Secondary | ICD-10-CM

## 2019-11-10 DIAGNOSIS — N83202 Unspecified ovarian cyst, left side: Secondary | ICD-10-CM

## 2019-11-10 DIAGNOSIS — Z30431 Encounter for routine checking of intrauterine contraceptive device: Secondary | ICD-10-CM | POA: Diagnosis not present

## 2019-11-10 NOTE — Patient Instructions (Signed)
Ovarian Cyst     An ovarian cyst is a fluid-filled sac that forms on an ovary. The ovaries are small organs that produce eggs in women. Various types of cysts can form on the ovaries. Some may cause symptoms and require treatment. Most ovarian cysts go away on their own, are not cancerous (are benign), and do not cause problems. Common types of ovarian cysts include:  Functional (follicle) cysts. ? Occur during the menstrual cycle, and usually go away with the next menstrual cycle if you do not get pregnant. ? Usually cause no symptoms.  Endometriomas. ? Are cysts that form from the tissue that lines the uterus (endometrium). ? Are sometimes called "chocolate cysts" because they become filled with blood that turns brown. ? Can cause pain in the lower abdomen during intercourse and during your period.  Cystadenoma cysts. ? Develop from cells on the outside surface of the ovary. ? Can get very large and cause lower abdomen pain and pain with intercourse. ? Can cause severe pain if they twist or break open (rupture).  Dermoid cysts. ? Are sometimes found in both ovaries. ? May contain different kinds of body tissue, such as skin, teeth, hair, or cartilage. ? Usually do not cause symptoms unless they get very big.  Theca lutein cysts. ? Occur when too much of a certain hormone (human chorionic gonadotropin) is produced and overstimulates the ovaries to produce an egg. ? Are most common after having procedures used to assist with the conception of a baby (in vitro fertilization). What are the causes? Ovarian cysts may be caused by:  Ovarian hyperstimulation syndrome. This is a condition that can develop from taking fertility medicines. It causes multiple large ovarian cysts to form.  Polycystic ovarian syndrome (PCOS). This is a common hormonal disorder that can cause ovarian cysts, as well as problems with your period or fertility. What increases the risk? The following factors may  make you more likely to develop ovarian cysts:  Being overweight or obese.  Taking fertility medicines.  Taking certain forms of hormonal birth control.  Smoking. What are the signs or symptoms? Many ovarian cysts do not cause symptoms. If symptoms are present, they may include:  Pelvic pain or pressure.  Pain in the lower abdomen.  Pain during sex.  Abdominal swelling.  Abnormal menstrual periods.  Increasing pain with menstrual periods. How is this diagnosed? These cysts are commonly found during a routine pelvic exam. You may have tests to find out more about the cyst, such as:  Ultrasound.  X-ray of the pelvis.  CT scan.  MRI.  Blood tests. How is this treated? Many ovarian cysts go away on their own without treatment. Your health care provider may want to check your cyst regularly for 2-3 months to see if it changes. If you are in menopause, it is especially important to have your cyst monitored closely because menopausal women have a higher rate of ovarian cancer. When treatment is needed, it may include:  Medicines to help relieve pain.  A procedure to drain the cyst (aspiration).  Surgery to remove the whole cyst.  Hormone treatment or birth control pills. These methods are sometimes used to help dissolve a cyst. Follow these instructions at home:  Take over-the-counter and prescription medicines only as told by your health care provider.  Do not drive or use heavy machinery while taking prescription pain medicine.  Get regular pelvic exams and Pap tests as often as told by your health care provider.    Return to your normal activities as told by your health care provider. Ask your health care provider what activities are safe for you.  Do not use any products that contain nicotine or tobacco, such as cigarettes and e-cigarettes. If you need help quitting, ask your health care provider.  Keep all follow-up visits as told by your health care provider.  This is important. Contact a health care provider if:  Your periods are late, irregular, or painful, or they stop.  You have pelvic pain that does not go away.  You have pressure on your bladder or trouble emptying your bladder completely.  You have pain during sex.  You have any of the following in your abdomen: ? A feeling of fullness. ? Pressure. ? Discomfort. ? Pain that does not go away. ? Swelling.  You feel generally ill.  You become constipated.  You lose your appetite.  You develop severe acne.  You start to have more body hair and facial hair.  You are gaining weight or losing weight without changing your exercise and eating habits.  You think you may be pregnant. Get help right away if:  You have abdominal pain that is severe or gets worse.  You cannot eat or drink without vomiting.  You suddenly develop a fever.  Your menstrual period is much heavier than usual. This information is not intended to replace advice given to you by your health care provider. Make sure you discuss any questions you have with your health care provider. Document Released: 11/19/2005 Document Revised: 02/17/2018 Document Reviewed: 04/22/2016 Elsevier Patient Education  2020 Elsevier Inc.  

## 2019-11-24 ENCOUNTER — Ambulatory Visit: Payer: 59 | Attending: Internal Medicine

## 2019-11-24 DIAGNOSIS — Z20822 Contact with and (suspected) exposure to covid-19: Secondary | ICD-10-CM

## 2019-11-26 LAB — NOVEL CORONAVIRUS, NAA: SARS-CoV-2, NAA: NOT DETECTED

## 2020-02-23 ENCOUNTER — Encounter: Payer: Self-pay | Admitting: Adult Health Nurse Practitioner

## 2020-02-23 ENCOUNTER — Other Ambulatory Visit: Payer: Self-pay

## 2020-02-23 ENCOUNTER — Ambulatory Visit: Payer: 59 | Admitting: Adult Health Nurse Practitioner

## 2020-02-23 VITALS — BP 129/77 | HR 83 | Temp 98.3°F | Resp 18 | Ht 63.0 in | Wt 161.8 lb

## 2020-02-23 DIAGNOSIS — R2231 Localized swelling, mass and lump, right upper limb: Secondary | ICD-10-CM | POA: Diagnosis not present

## 2020-02-23 MED ORDER — METHYLPREDNISOLONE 4 MG PO TBPK: ORAL_TABLET | ORAL | 0 refills | Status: DC

## 2020-02-23 NOTE — Progress Notes (Signed)
Chief Complaint  Patient presents with  . swelling right upper arm x yesterday around approx. 1 pm whi    took benadryl for swelling yesterday along with ibuprofen and ice with no relief.  Woke up this a.m. with continued swelling, no pain just sore and no itchiness.  ? bug bite    HPI   Patient presents with swelling to her left upper extremity starting 2 days ago.  She took a Benadryl yesterday with no improvement in her extremity swelling.  No known exposure to any bite or toxins.  No bite and no pain.  No erythema or warmth.  Swelling from the shoulder to the elbow.      Problem List    Problem List: 2021-03: Localized swelling of right upper extremity 2013-09: Iron deficiency anemia 2013-09: Exercise-induced asthma Depression ADD (attention deficit disorder)   Allergies   has No Known Allergies.  Medications    Current Outpatient Medications:  .  FLUoxetine (PROZAC) 20 MG tablet, Take 1 tablet (20 mg total) by mouth daily., Disp: 90 tablet, Rfl: 3 .  levonorgestrel (MIRENA) 20 MCG/24HR IUD, 1 each by Intrauterine route once., Disp: , Rfl:  .  VENTOLIN HFA 108 (90 Base) MCG/ACT inhaler, INHALE 1-2 PUFFS INTO THE LUNGS EVERY 4 (FOUR) HOURS AS NEEDED FOR WHEEZING OR SHORTNESS OF BREATH., Disp: 18 Inhaler, Rfl: 0 .  methylPREDNISolone (MEDROL DOSEPAK) 4 MG TBPK tablet, As directed on pkg label, Disp: 21 each, Rfl: 0   Review of Systems    Constitutional: Negative for activity change, appetite change, chills and fever.  HENT: Negative for congestion, nosebleeds, trouble swallowing and voice change.   Respiratory: Negative for cough, shortness of breath and wheezing.   Cardiac:  Negative for chest pain, pressure, syncope  Gastrointestinal: Negative for diarrhea, nausea and vomiting.  Genitourinary: Negative for difficulty urinating, dysuria, flank pain and hematuria.  Musculoskeletal: Negative for back pain, joint swelling and neck pain.  Neurological: Negative for  dizziness, speech difficulty, light-headedness and numbness.  See HPI. All other review of systems negative.     Physical Exam:    height is 5\' 3"  (1.6 m) and weight is 161 lb 12.8 oz (73.4 kg). Her temporal temperature is 98.3 F (36.8 C). Her blood pressure is 129/77 and her pulse is 83. Her respiration is 18 and oxygen saturation is 98%.   Physical Examination: General appearance - alert, well appearing, and in no distress and oriented to person, place, and time Mental status - normal mood, behavior, speech, dress, motor activity, and thought processes Eyes - PERRL. Extraocular movements intact.  No nystagmus.  Neck - supple, no significant adenopathy, carotids upstroke normal bilaterally, no bruits, thyroid exam: thyroid is normal in size without nodules or tenderness Chest - clear to auscultation, no wheezes, rales or rhonchi, symmetric air entry  Heart - normal rate, regular rhythm, normal S1, S2, no murmurs, rubs, clicks or gallops Extremities - dependent LE edema without clubbing or cyanosis Skin - normal coloration and turgor, no rashes, no suspicious skin lesions noted  Musculoskeletal exam: swelling to the right upper extremity, no warmth, redness, or joint swelling.  No pain.  Full ROM to left upper extremity.  .  No hyperpigmentation of skin.  No current hematomas noted   Lab /Imaging Review    orders written for new lab studies as appropriate; see orders.   Assessment & Plan:  Christy Alvarado is a 34 y.o. female    1. Localized swelling of right upper extremity  No orders of the defined types were placed in this encounter.  Meds ordered this encounter  Medications  . methylPREDNISolone (MEDROL DOSEPAK) 4 MG TBPK tablet    Sig: As directed on pkg label    Dispense:  21 each    Refill:  0     Glyn Ade, NP

## 2020-02-23 NOTE — Patient Instructions (Signed)
° ° ° °  If you have lab work done today you will be contacted with your lab results within the next 2 weeks.  If you have not heard from us then please contact us. The fastest way to get your results is to register for My Chart. ° ° °IF you received an x-ray today, you will receive an invoice from [facility]. Please contact [facility] at [phone] with questions or concerns regarding your invoice.  ° °IF you received labwork today, you will receive an invoice from [facility]. Please contact [facility] at [phone] with questions or concerns regarding your invoice.  ° °Our billing staff will not be able to assist you with questions regarding bills from these companies. ° °You will be contacted with the lab results as soon as they are available. The fastest way to get your results is to activate your My Chart account. Instructions are located on the last page of this paperwork. If you have not heard from us regarding the results in 2 weeks, please contact this office. °  ° ° ° °

## 2020-03-08 ENCOUNTER — Encounter: Payer: 59 | Admitting: Adult Health Nurse Practitioner

## 2020-04-27 ENCOUNTER — Other Ambulatory Visit: Payer: Self-pay | Admitting: Family Medicine

## 2020-04-27 DIAGNOSIS — J45990 Exercise induced bronchospasm: Secondary | ICD-10-CM

## 2020-04-29 ENCOUNTER — Ambulatory Visit (HOSPITAL_COMMUNITY)
Admission: EM | Admit: 2020-04-29 | Discharge: 2020-04-29 | Disposition: A | Discharge status: Home / Self Care | Payer: 59 | Attending: Family Medicine | Admitting: Family Medicine

## 2020-04-29 ENCOUNTER — Encounter (HOSPITAL_COMMUNITY): Payer: Self-pay

## 2020-04-29 ENCOUNTER — Other Ambulatory Visit: Payer: Self-pay

## 2020-04-29 DIAGNOSIS — J22 Unspecified acute lower respiratory infection: Secondary | ICD-10-CM | POA: Diagnosis not present

## 2020-04-29 MED ORDER — AZITHROMYCIN 250 MG PO TABS: ORAL_TABLET | ORAL | 0 refills | Status: DC

## 2020-04-29 MED ORDER — ALBUTEROL SULFATE HFA 108 (90 BASE) MCG/ACT IN AERS: 1.0000 | INHALATION_SPRAY | Freq: Four times a day (QID) | RESPIRATORY_TRACT | 0 refills | Status: DC | PRN

## 2020-04-29 MED ORDER — BENZONATATE 200 MG PO CAPS: 200.0000 mg | ORAL_CAPSULE | Freq: Two times a day (BID) | ORAL | 0 refills | Status: DC | PRN

## 2020-04-29 NOTE — Discharge Instructions (Addendum)
Drink plenty of fluids Take the antibiotic as directed Take tessalon for the cough Call or return for problems

## 2020-04-29 NOTE — ED Triage Notes (Signed)
Patient states she started having "allergy symptoms" Friday with sinus congestion, postnasal drip, sneezing. Starting Saturday night patient developed a cough. She has been covid tested and was negative. Patient reports a temperature of 100.5 yesterday. Denies loss of smell or taste, body aches, chills, shortness of breath, wheezing.

## 2020-04-29 NOTE — ED Provider Notes (Signed)
[place]    CSN: [id number] Arrival date & time: 04/29/20  [id number]      History   Chief Complaint Chief Complaint  Patient presents with  . Cough    HPI Christy Alvarado is a 34 y.o. female.   HPI  Patient has had a cough for over a week.  She is very tired.  Cough is keeping her awake at night.  No real headache, runny nose, sinus congestion.  No body aches.  No loss of taste or smell.  She went to [facility] and had a Covid test which was negative.  She is here for persistent cough and congestion.  Last night her temperature was 100.5.  Past Medical History:  Diagnosis Date  . ADD (attention deficit disorder)   . Anemia   . Anxiety   . Asthma    grew out of it, childhood  . Depression   . Fatigue   . Fibroid of cervix   . Plantar wart of left foot 04/14/2014  . Seasonal allergies   . Syncope, vasovagal     Patient Active Problem List   Diagnosis Date Noted  . Localized swelling of right upper extremity 02/23/2020  . Iron deficiency anemia 09/01/2012  . Exercise-induced asthma 09/01/2012  . Depression   . ADD (attention deficit disorder)     Past Surgical History:  Procedure Laterality Date  . HYSTEROSCOPY WITH D & C N/A 08/12/2018   Procedure: DILATATION AND CURETTAGE /HYSTEROSCOPY;  Surgeon: Salvadore Dom, MD;  Location: [facility];  Service: Gynecology;  Laterality: N/A;  . NASAL SEPTUM SURGERY    . PROLAPSED UTERINE FIBROID LIGATION N/A 08/12/2018   Procedure: MYOMECTOMY CERVICAL;  Surgeon: Salvadore Dom, MD;  Location: [facility];  Service: Gynecology;  Laterality: N/A;  . RECONSTRUCTION MANDIBLE / MAXILLA     underbite correction    OB History    Gravida  0   Para  0   Term  0   Preterm  0   AB  0   Living  0     SAB  0   TAB  0   Ectopic  0   Multiple  0   Live Births               Home Medications    Prior to Admission medications   Medication Sig Start Date End Date Taking?  Authorizing Provider  albuterol (VENTOLIN HFA) 108 (90 Base) MCG/ACT inhaler Inhale 1-2 puffs into the lungs every 6 (six) hours as needed for wheezing or shortness of breath. 04/29/20   Raylene Everts, MD  azithromycin (ZITHROMAX Z-PAK) 250 MG tablet Take two pills today followed by one a day until gone 04/29/20   Raylene Everts, MD  benzonatate (TESSALON) 200 MG capsule Take 1 capsule (200 mg total) by mouth 2 (two) times daily as needed for cough. 04/29/20   Raylene Everts, MD  FLUoxetine (PROZAC) 20 MG tablet Take 1 tablet (20 mg total) by mouth daily. 07/13/19   Salvadore Dom, MD  levonorgestrel (MIRENA) 20 MCG/24HR IUD 1 each by Intrauterine route once.    [provider]    Family History Family History  Problem Relation Age of Onset  . Cancer Father        prostate  . Heart disease Maternal Grandmother   . Hyperlipidemia Maternal Grandmother   . Stroke Maternal Grandmother   . Hypertension Maternal Grandmother  Social History Social History   Tobacco Use  . Smoking status: Never Smoker  . Smokeless tobacco: Never Used  Substance Use Topics  . Alcohol use: Yes    Alcohol/week: 5.0 standard drinks    Types: 5 Cans of beer per week  . Drug use: No     Allergies   Patient has no known allergies.   Review of Systems Review of Systems  Constitutional: Positive for fatigue. Negative for chills and fever.  HENT: Negative for congestion, rhinorrhea and sore throat.   Respiratory: Positive for cough and shortness of breath.   Cardiovascular: Negative for chest pain.  Musculoskeletal: Negative for myalgias.  Neurological: Negative for headaches.     Physical Exam Triage Vital Signs ED Triage Vitals  Enc Vitals Group     BP 04/29/20 0822 126/84     Pulse Rate 04/29/20 0822 87     Resp 04/29/20 0822 14     Temp 04/29/20 0822 99.2 F (37.3 C)     Temp Source 04/29/20 0822 Oral     SpO2 04/29/20 0822 97 %     Weight --      Height --       Head Circumference --      Peak Flow --      Pain Score 04/29/20 0853 0     Pain Loc --      Pain Edu? --      Excl. in [place]? --    No data found.  Updated Vital Signs BP 126/84 (BP Location: Left Arm)   Pulse 87   Temp 99.2 F (37.3 C) (Oral)   Resp 14   SpO2 97%      Physical Exam Constitutional:      General: She is not in acute distress.    Appearance: Normal appearance. She is well-developed and normal weight.  HENT:     Head: Normocephalic and atraumatic.     Right Ear: Tympanic membrane, ear canal and external ear normal.     Left Ear: Tympanic membrane, ear canal and external ear normal.     Nose: No congestion.     Mouth/Throat:     Mouth: Mucous membranes are moist.     Pharynx: No posterior oropharyngeal erythema.  Eyes:     Conjunctiva/sclera: Conjunctivae normal.     Pupils: Pupils are equal, round, and reactive to light.  Cardiovascular:     Rate and Rhythm: Normal rate and regular rhythm.     Heart sounds: Normal heart sounds.  Pulmonary:     Effort: Pulmonary effort is normal. No respiratory distress.     Breath sounds: Normal breath sounds. No wheezing, rhonchi or rales.  Chest:     Chest wall: No tenderness.  Musculoskeletal:        General: Normal range of motion.     Cervical back: Normal range of motion.  Lymphadenopathy:     Cervical: No cervical adenopathy.  Skin:    General: Skin is warm and dry.  Neurological:     Mental Status: She is alert.  Psychiatric:        Mood and Affect: Mood normal.        Behavior: Behavior normal.      UC Treatments / Results  Labs (all labs ordered are listed, but only abnormal results are displayed) Labs Reviewed - No data to display  EKG   Radiology No results found.  Procedures Procedures (including critical care time)  Medications Ordered in UC Medications -  No data to display  Initial Impression / Assessment and Plan / UC Course  I have reviewed the triage vital signs and the nursing  notes.  Pertinent labs & imaging results that were available during my care of the patient were reviewed by me and considered in my medical decision making (see chart for details).     Lungs are clear.  Discussed that most bronchitis is caused by a virus.  Since she is worsening after 7 days of symptomatic treatment we will give her a course of antibiotics.  Tessalon for cough.  Patient requests a refill of her albuterol.  She has needed it periodically in the past.  She has exercise-induced asthma. Final Clinical Impressions(s) / UC Diagnoses   Final diagnoses:  LRTI (lower respiratory tract infection)     Discharge Instructions     Drink plenty of fluids Take the antibiotic as directed Take tessalon for the cough Call or return for problems    ED Prescriptions    Medication Sig Dispense Auth. Provider   azithromycin (ZITHROMAX Z-PAK) 250 MG tablet Take two pills today followed by one a day until gone 6 tablet Raylene Everts, MD   benzonatate (TESSALON) 200 MG capsule Take 1 capsule (200 mg total) by mouth 2 (two) times daily as needed for cough. 20 capsule Raylene Everts, MD   albuterol (VENTOLIN HFA) 108 (90 Base) MCG/ACT inhaler Inhale 1-2 puffs into the lungs every 6 (six) hours as needed for wheezing or shortness of breath. 18 g Raylene Everts, MD     PDMP not reviewed this encounter.   Raylene Everts, MD 04/29/20 1006

## 2020-05-31 ENCOUNTER — Ambulatory Visit: Payer: 59 | Admitting: Registered Nurse

## 2020-05-31 ENCOUNTER — Other Ambulatory Visit: Payer: Self-pay

## 2020-05-31 ENCOUNTER — Encounter: Payer: Self-pay | Admitting: Registered Nurse

## 2020-05-31 VITALS — BP 115/79 | HR 67 | Temp 98.4°F | Resp 17 | Ht 63.0 in | Wt 159.4 lb

## 2020-05-31 DIAGNOSIS — Z13220 Encounter for screening for lipoid disorders: Secondary | ICD-10-CM

## 2020-05-31 DIAGNOSIS — Z1329 Encounter for screening for other suspected endocrine disorder: Secondary | ICD-10-CM

## 2020-05-31 DIAGNOSIS — F988 Other specified behavioral and emotional disorders with onset usually occurring in childhood and adolescence: Secondary | ICD-10-CM | POA: Diagnosis not present

## 2020-05-31 DIAGNOSIS — J302 Other seasonal allergic rhinitis: Secondary | ICD-10-CM

## 2020-05-31 DIAGNOSIS — Z130 Encounter for screening for diseases of the blood and blood-forming organs and certain disorders involving the immune mechanism: Secondary | ICD-10-CM

## 2020-05-31 DIAGNOSIS — Z13228 Encounter for screening for other metabolic disorders: Secondary | ICD-10-CM

## 2020-05-31 MED ORDER — MONTELUKAST SODIUM 10 MG PO TABS: 10.0000 mg | ORAL_TABLET | Freq: Every day | ORAL | 3 refills | Status: DC

## 2020-05-31 MED ORDER — METHYLPHENIDATE HCL ER (OSM) 18 MG PO TBCR: 18.0000 mg | EXTENDED_RELEASE_TABLET | Freq: Every day | ORAL | 0 refills | Status: DC

## 2020-05-31 NOTE — Progress Notes (Signed)
Established Patient Office Visit  Subjective:  Patient ID: Christy Alvarado, female    DOB: 20-Nov-1986  Age: 34 y.o. MRN: [id number]  CC:  Chief Complaint  Patient presents with  . Transitions Of Care    Patient is here for here to Establish care with a new provider and labs. Patient has no other questions or concerns    HPI Christy Alvarado presents for visit to establish care.   Review of history:  Anxiety and depression: Managed with prozac 20mg  PO qd. Has been on this since early teens. Good effect. Has tried other anxiolytics and antidepressants without success. Denies hi/si, feeling well overall, but sometimes gets anxious or feels overwhelmed.  ADD: Had been on concerta consistently through high school, but has been intermittently since. Feeling like now that work is picking up post COVID, she is getting very easily overwhelmed and distracted. This is contributing to anxiety and depression.  IUD in place, uterine fibroid, cervical fibroid: Follows regularly with OBGYN. No recent issues. No current complaints   Vasovagal syncope: two instances, once with IUD placement, other with IV placement. Has not happened unprovoked. Pt is not concerned.   Seasonal Allergies: Taking xyzal daily, but still having some breakthrough coughing and lower respiratory congestion. Hoping to do something more. Albuterol helps somewhat but had recent lower respiratory infection that she is hoping to avoid repeating.  Otherwise, no concerns or complaints. Feeling well overall.   Past Medical History:  Diagnosis Date  . ADD (attention deficit disorder)   . Anemia   . Anxiety   . Asthma    grew out of it, childhood  . Depression   . Fatigue   . Fibroid of cervix   . Plantar wart of left foot 04/14/2014  . Seasonal allergies   . Syncope, vasovagal     Past Surgical History:  Procedure Laterality Date  . HYSTEROSCOPY WITH D & C N/A 08/12/2018   Procedure: DILATATION AND CURETTAGE /HYSTEROSCOPY;   Surgeon: Salvadore Dom, MD;  Location: [facility];  Service: Gynecology;  Laterality: N/A;  . NASAL SEPTUM SURGERY    . PROLAPSED UTERINE FIBROID LIGATION N/A 08/12/2018   Procedure: MYOMECTOMY CERVICAL;  Surgeon: Salvadore Dom, MD;  Location: [facility];  Service: Gynecology;  Laterality: N/A;  . RECONSTRUCTION MANDIBLE / MAXILLA     underbite correction    Family History  Problem Relation Age of Onset  . Cancer Father        prostate  . Stroke Father   . Heart disease Maternal Grandmother   . Hyperlipidemia Maternal Grandmother   . Stroke Maternal Grandmother   . Hypertension Maternal Grandmother     Social History   Socioeconomic History  . Marital status: Significant Other    Spouse name: n/a  . Number of children: 0  . Years of education: 15+  . Highest education level: Not on file  Occupational History  . Occupation: Designer, multimedia    Comment: [employer]  Tobacco Use  . Smoking status: Never Smoker  . Smokeless tobacco: Never Used  Vaping Use  . Vaping Use: Never used  Substance and Sexual Activity  . Alcohol use: Yes    Alcohol/week: 5.0 standard drinks    Types: 5 Cans of beer per week  . Drug use: No  . Sexual activity: Yes    Partners: Male    Birth control/protection: I.U.D.    Comment: Mirena inserted 07/21/19  Other Topics Concern  .  Not on file  Social History Narrative   2 sisters   Brother    Lives with partner   Social Determinants of Health   Financial Resource Strain:   . Difficulty of Paying Living Expenses:   Food Insecurity:   . Worried About Charity fundraiser in the Last Year:   . Arboriculturist in the Last Year:   Transportation Needs:   . Film/video editor (Medical):   Marland Kitchen Lack of Transportation (Non-Medical):   Physical Activity:   . Days of Exercise per Week:   . Minutes of Exercise per Session:   Stress:   . Feeling of Stress :   Social Connections:   .  Frequency of Communication with Friends and Family:   . Frequency of Social Gatherings with Friends and Family:   . Attends Religious Services:   . Active Member of Clubs or Organizations:   . Attends Archivist Meetings:   Marland Kitchen Marital Status:   Intimate Partner Violence:   . Fear of Current or Ex-Partner:   . Emotionally Abused:   Marland Kitchen Physically Abused:   . Sexually Abused:     Outpatient Medications Prior to Visit  Medication Sig Dispense Refill  . albuterol (VENTOLIN HFA) 108 (90 Base) MCG/ACT inhaler Inhale 1-2 puffs into the lungs every 6 (six) hours as needed for wheezing or shortness of breath. 18 g 0  . FLUoxetine (PROZAC) 20 MG tablet Take 1 tablet (20 mg total) by mouth daily. 90 tablet 3  . levonorgestrel (MIRENA) 20 MCG/24HR IUD 1 each by Intrauterine route once.    Marland Kitchen azithromycin (ZITHROMAX Z-PAK) 250 MG tablet Take two pills today followed by one a day until gone 6 tablet 0  . benzonatate (TESSALON) 200 MG capsule Take 1 capsule (200 mg total) by mouth 2 (two) times daily as needed for cough. 20 capsule 0   No facility-administered medications prior to visit.    No Known Allergies  ROS Review of Systems  Constitutional: Negative.   HENT: Negative.   Eyes: Negative.   Respiratory: Negative.   Cardiovascular: Negative.   Gastrointestinal: Negative.   Endocrine: Negative.   Genitourinary: Negative.   Musculoskeletal: Negative.   Skin: Negative.   Allergic/Immunologic: Negative.   Neurological: Negative.   Hematological: Negative.   Psychiatric/Behavioral: Positive for decreased concentration. Negative for agitation, behavioral problems, confusion, dysphoric mood, hallucinations, self-injury, sleep disturbance and suicidal ideas. The patient is nervous/anxious. The patient is not hyperactive.   All other systems reviewed and are negative.     Objective:    Physical Exam Vitals and nursing note reviewed.  Constitutional:      General: She is not in  acute distress.    Appearance: Normal appearance. She is normal weight. She is not ill-appearing, toxic-appearing or diaphoretic.  Cardiovascular:     Rate and Rhythm: Normal rate and regular rhythm.  Pulmonary:     Effort: Pulmonary effort is normal. No respiratory distress.  Skin:    General: Skin is warm and dry.  Neurological:     General: No focal deficit present.     Mental Status: She is alert and oriented to person, place, and time. Mental status is at baseline.  Psychiatric:        Mood and Affect: Mood normal.        Behavior: Behavior normal.        Thought Content: Thought content normal.        Judgment: Judgment normal.  BP 115/79   Pulse 67   Temp 98.4 F (36.9 C) (Temporal)   Resp 17   Ht 5\' 3"  (1.6 m)   Wt 159 lb 6.4 oz (72.3 kg)   SpO2 97%   BMI 28.24 kg/m  Wt Readings from Last 3 Encounters:  05/31/20 159 lb 6.4 oz (72.3 kg)  02/23/20 161 lb 12.8 oz (73.4 kg)  11/10/19 159 lb (72.1 kg)     There are no preventive care reminders to display for this patient.  There are no preventive care reminders to display for this patient.  Lab Results  Component Value Date   TSH 1.010 03/03/2018   Lab Results  Component Value Date   WBC 5.4 07/13/2019   HGB 12.8 07/13/2019   HCT 39.5 07/13/2019   MCV 90 07/13/2019   PLT 163 07/13/2019   Lab Results  Component Value Date   NA 142 07/13/2019   K 4.3 07/13/2019   CO2 23 07/13/2019   GLUCOSE 89 07/13/2019   BUN 13 07/13/2019   CREATININE 0.90 07/13/2019   BILITOT 0.5 07/13/2019   ALKPHOS 42 07/13/2019   AST 19 07/13/2019   ALT 16 07/13/2019   PROT 6.6 07/13/2019   ALBUMIN 4.3 07/13/2019   CALCIUM 8.8 07/13/2019   Lab Results  Component Value Date   CHOL 145 07/13/2019   Lab Results  Component Value Date   HDL 65 07/13/2019   Lab Results  Component Value Date   LDLCALC 64 07/13/2019   Lab Results  Component Value Date   TRIG 79 07/13/2019   Lab Results  Component Value Date    CHOLHDL 2.2 07/13/2019   No results found for: HGBA1C    Assessment & Plan:   Problem List Items Addressed This Visit      Other   ADD (attention deficit disorder)   Relevant Medications   methylphenidate (CONCERTA) 18 MG PO CR tablet    Other Visit Diagnoses    Seasonal allergies    -  Primary   Relevant Medications   montelukast (SINGULAIR) 10 MG tablet   Screening for endocrine, metabolic and immunity disorder       Relevant Orders   TSH   Hemoglobin A1c   CBC with Differential   Comprehensive metabolic panel   Lipid screening       Relevant Orders   Lipid panel      Meds ordered this encounter  Medications  . montelukast (SINGULAIR) 10 MG tablet    Sig: Take 1 tablet (10 mg total) by mouth at bedtime.    Dispense:  30 tablet    Refill:  3    Order Specific Question:   Supervising Provider    AnswerPamella Pert, Lilia Argue [[id number]]  . methylphenidate (CONCERTA) 18 MG PO CR tablet    Sig: Take 1 tablet (18 mg total) by mouth daily. Generic ok    Dispense:  30 tablet    Refill:  0    Order Specific Question:   Supervising Provider    AnswerRutherford Guys [[id number]]    Follow-up: No follow-ups on file.   PLAN  Labs collected, will follow up as warranted  Restart concerta at 18mg  PO qd  Start singulair 10mg  PO qhs   Return prn  Patient encouraged to call clinic with any questions, comments, or concerns.  Maximiano Coss, NP

## 2020-05-31 NOTE — Patient Instructions (Signed)
° ° ° °  If you have lab work done today you will be contacted with your lab results within the next 2 weeks.  If you have not heard from us then please contact us. The fastest way to get your results is to register for My Chart. ° ° °IF you received an x-ray today, you will receive an invoice from [facility]. Please contact [facility] at [phone] with questions or concerns regarding your invoice.  ° °IF you received labwork today, you will receive an invoice from [facility]. Please contact [facility] at [phone] with questions or concerns regarding your invoice.  ° °Our billing staff will not be able to assist you with questions regarding bills from these companies. ° °You will be contacted with the lab results as soon as they are available. The fastest way to get your results is to activate your My Chart account. Instructions are located on the last page of this paperwork. If you have not heard from us regarding the results in 2 weeks, please contact this office. °  ° ° ° °

## 2020-06-01 LAB — COMPREHENSIVE METABOLIC PANEL
ALT: 14 IU/L (ref 0–32)
AST: 18 IU/L (ref 0–40)
Albumin/Globulin Ratio: 1.6 (ref 1.2–2.2)
Albumin: 4.2 g/dL (ref 3.8–4.8)
Alkaline Phosphatase: 55 IU/L (ref 48–121)
BUN/Creatinine Ratio: 16 (ref 9–23)
BUN: 15 mg/dL (ref 6–20)
Bilirubin Total: 0.5 mg/dL (ref 0.0–1.2)
CO2: 21 mmol/L (ref 20–29)
Calcium: 9 mg/dL (ref 8.7–10.2)
Chloride: 102 mmol/L (ref 96–106)
Creatinine, Ser: 0.94 mg/dL (ref 0.57–1.00)
GFR calc Af Amer: 93 mL/min/{1.73_m2} (ref >59)
GFR calc non Af Amer: 81 mL/min/{1.73_m2} (ref >59)
Globulin, Total: 2.6 g/dL (ref 1.5–4.5)
Glucose: 85 mg/dL (ref 65–99)
Potassium: 4.4 mmol/L (ref 3.5–5.2)
Sodium: 138 mmol/L (ref 134–144)
Total Protein: 6.8 g/dL (ref 6.0–8.5)

## 2020-06-01 LAB — CBC WITH DIFFERENTIAL/PLATELET
Basophils Absolute: 0 10*3/uL (ref 0.0–0.2)
Basos: 1 %
EOS (ABSOLUTE): 0.1 10*3/uL (ref 0.0–0.4)
Eos: 2 %
Hematocrit: 42.6 % (ref 34.0–46.6)
Hemoglobin: 14.1 g/dL (ref 11.1–15.9)
Immature Grans (Abs): 0 10*3/uL (ref 0.0–0.1)
Immature Granulocytes: 0 %
Lymphocytes Absolute: 2 10*3/uL (ref 0.7–3.1)
Lymphs: 32 %
MCH: 29.1 pg (ref 26.6–33.0)
MCHC: 33.1 g/dL (ref 31.5–35.7)
MCV: 88 fL (ref 79–97)
Monocytes Absolute: 0.4 10*3/uL (ref 0.1–0.9)
Monocytes: 7 %
Neutrophils Absolute: 3.6 10*3/uL (ref 1.4–7.0)
Neutrophils: 58 %
Platelets: 171 10*3/uL (ref 150–450)
RBC: 4.84 x10E6/uL (ref 3.77–5.28)
RDW: 12.7 % (ref 11.7–15.4)
WBC: 6.2 10*3/uL (ref 3.4–10.8)

## 2020-06-01 LAB — TSH: TSH: 2.74 u[IU]/mL (ref 0.450–4.500)

## 2020-06-01 LAB — LIPID PANEL
Chol/HDL Ratio: 2.3 ratio (ref 0.0–4.4)
Cholesterol, Total: 159 mg/dL (ref 100–199)
HDL: 70 mg/dL (ref >39)
LDL Chol Calc (NIH): 78 mg/dL (ref 0–99)
Triglycerides: 52 mg/dL (ref 0–149)
VLDL Cholesterol Cal: 11 mg/dL (ref 5–40)

## 2020-06-01 LAB — HEMOGLOBIN A1C
Est. average glucose Bld gHb Est-mCnc: 97 mg/dL
Hgb A1c MFr Bld: 5 % (ref 4.8–5.6)

## 2020-07-18 NOTE — Progress Notes (Signed)
34 y.o. G0P0000 Significant Other White or Caucasian Not Hispanic or Latino female here for annual exam.   She has a mirena IUD, inserted in 8/20. No cycles, still gets PMS and mild cramps, no bleeding.  She has anxiety and depression, worse PMS  Sexually active, same partner for over 2 years. Living together. No pain with sex.   She works for the [employer], is back at work. She is managing the box office.    H/O hysteroscopy and cervical myomectomy in 9/19.  No LMP recorded. (Menstrual status: IUD).          Sexually active: Yes.    The current method of family planning is IUD.    Exercising: Yes.    Running 3 days a week, burn bootcam Smoker:  no  Health Maintenance: Pap: 05-05-18 Neg; 2013 LGSIL  History of abnormal Pap:  Yes 2013 LGSIL - pt never followed up  MMG:  Never  BMD:   Never  Colonoscopy: Never  TDaP:  07/13/19 Gardasil: complete    reports that she has never smoked. She has never used smokeless tobacco. She reports current alcohol use of about 5.0 standard drinks of alcohol per week. She reports that she does not use drugs. She works in Pharmacologist for the [employer]   Past Medical History:  Diagnosis Date  . ADD (attention deficit disorder)   . Anemia   . Anxiety   . Asthma    grew out of it, childhood  . Depression   . Fatigue   . Fibroid of cervix   . Plantar wart of left foot 04/14/2014  . Seasonal allergies   . Syncope, vasovagal     Past Surgical History:  Procedure Laterality Date  . HYSTEROSCOPY WITH D & C N/A 08/12/2018   Procedure: DILATATION AND CURETTAGE /HYSTEROSCOPY;  Surgeon: Salvadore Dom, MD;  Location: [facility];  Service: Gynecology;  Laterality: N/A;  . NASAL SEPTUM SURGERY    . PROLAPSED UTERINE FIBROID LIGATION N/A 08/12/2018   Procedure: MYOMECTOMY CERVICAL;  Surgeon: Salvadore Dom, MD;  Location: [facility];  Service: Gynecology;  Laterality: N/A;  . RECONSTRUCTION MANDIBLE / MAXILLA      underbite correction    Current Outpatient Medications  Medication Sig Dispense Refill  . albuterol (VENTOLIN HFA) 108 (90 Base) MCG/ACT inhaler Inhale 1-2 puffs into the lungs every 6 (six) hours as needed for wheezing or shortness of breath. 18 g 0  . FLUoxetine (PROZAC) 20 MG tablet Take 1 tablet (20 mg total) by mouth daily. 90 tablet 3  . levonorgestrel (MIRENA) 20 MCG/24HR IUD 1 each by Intrauterine route once.    . methylphenidate (CONCERTA) 18 MG PO CR tablet Take 1 tablet (18 mg total) by mouth daily. Generic ok 30 tablet 0  . montelukast (SINGULAIR) 10 MG tablet Take 1 tablet (10 mg total) by mouth at bedtime. 30 tablet 3   No current facility-administered medications for this visit.    Family History  Problem Relation Age of Onset  . Cancer Father        prostate  . Stroke Father   . Heart disease Maternal Grandmother   . Hyperlipidemia Maternal Grandmother   . Stroke Maternal Grandmother   . Hypertension Maternal Grandmother     Review of Systems  All other systems reviewed and are negative.   Exam:   There were no vitals taken for this visit.  Weight change: @WEIGHTCHANGE @ Height:      Ht  Readings from Last 3 Encounters:  05/31/20 5\' 3"  (1.6 m)  02/23/20 5\' 3"  (1.6 m)  07/13/19 5\' 3"  (1.6 m)    General appearance: alert, cooperative and appears stated age Head: Normocephalic, without obvious abnormality, atraumatic Neck: no adenopathy, supple, symmetrical, trachea midline and thyroid normal to inspection and palpation Lungs: clear to auscultation bilaterally Cardiovascular: regular rate and rhythm Breasts: normal appearance, no masses or tenderness Abdomen: soft, non-tender; non distended,  no masses,  no organomegaly Extremities: extremities normal, atraumatic, no cyanosis or edema Skin: Skin color, texture, turgor normal. No rashes or lesions Lymph nodes: Cervical, supraclavicular, and axillary nodes normal. No abnormal inguinal nodes  palpated Neurologic: Grossly normal   Pelvic: External genitalia:  no lesions              Urethra:  normal appearing urethra with no masses, tenderness or lesions              Bartholins and Skenes: normal                 Vagina: erythematous appearing vagina with an increase in thick, clumpy, yellow vaginal d/c              Cervix: no lesions and IUD string 3-4 cm               Bimanual Exam:  Uterus:  normal size, contour, position, consistency, mobility, non-tender and anteverted              Adnexa: no mass, fullness, tenderness               Rectovaginal: Confirms               Anus:  normal sphincter tone, no lesions  Gae Dry chaperoned for the exam.  A:  Well Woman with normal exam  Anxiety and depression, worse with pms, seeing a counselor  IUD check  Vaginal d/c on exam, on questioning she c/o an intermittent odor, denies itching or d/c.   P:   Continue 20 mg daily prozac and add 10 mg during PMS week. She will call if she wants to increase to 30 mg every day.   Pap with hpv  Labs with primary  Discussed breast self exam  Discussed calcium and vit D intake  Send nuswab vaginitis panel

## 2020-07-20 ENCOUNTER — Other Ambulatory Visit: Payer: Self-pay

## 2020-07-20 ENCOUNTER — Encounter: Payer: Self-pay | Admitting: Obstetrics and Gynecology

## 2020-07-20 ENCOUNTER — Ambulatory Visit: Payer: 59 | Admitting: Obstetrics and Gynecology

## 2020-07-20 ENCOUNTER — Other Ambulatory Visit (HOSPITAL_COMMUNITY)
Admission: RE | Admit: 2020-07-20 | Discharge: 2020-07-20 | Disposition: A | Discharge status: Home / Self Care | Payer: 59 | Source: Ambulatory Visit | Attending: Obstetrics and Gynecology | Admitting: Obstetrics and Gynecology

## 2020-07-20 VITALS — BP 110/64 | HR 67 | Ht 63.0 in | Wt 160.0 lb

## 2020-07-20 DIAGNOSIS — Z30431 Encounter for routine checking of intrauterine contraceptive device: Secondary | ICD-10-CM | POA: Diagnosis not present

## 2020-07-20 DIAGNOSIS — N943 Premenstrual tension syndrome: Secondary | ICD-10-CM

## 2020-07-20 DIAGNOSIS — Z01419 Encounter for gynecological examination (general) (routine) without abnormal findings: Secondary | ICD-10-CM

## 2020-07-20 DIAGNOSIS — F329 Major depressive disorder, single episode, unspecified: Secondary | ICD-10-CM

## 2020-07-20 DIAGNOSIS — F419 Anxiety disorder, unspecified: Secondary | ICD-10-CM

## 2020-07-20 DIAGNOSIS — Z124 Encounter for screening for malignant neoplasm of cervix: Secondary | ICD-10-CM | POA: Insufficient documentation

## 2020-07-20 DIAGNOSIS — N898 Other specified noninflammatory disorders of vagina: Secondary | ICD-10-CM

## 2020-07-20 MED ORDER — FLUOXETINE HCL 20 MG PO CAPS: 20.0000 mg | ORAL_CAPSULE | Freq: Every day | ORAL | 3 refills | Status: DC

## 2020-07-20 MED ORDER — FLUOXETINE HCL 10 MG PO CAPS: ORAL_CAPSULE | ORAL | 3 refills | Status: DC

## 2020-07-20 NOTE — Patient Instructions (Signed)
EXERCISE AND DIET:  We recommended that you start or continue a regular exercise program for good health. Regular exercise means any activity that makes your heart beat faster and makes you sweat.  We recommend exercising at least 30 minutes per day at least 3 days a week, preferably 4 or 5.  We also recommend a diet low in fat and sugar.  Inactivity, poor dietary choices and obesity can cause diabetes, heart attack, stroke, and kidney damage, among others.    ALCOHOL AND SMOKING:  Women should limit their alcohol intake to no more than 7 drinks/beers/glasses of wine (combined, not each!) per week. Moderation of alcohol intake to this level decreases your risk of breast cancer and liver damage. And of course, no recreational drugs are part of a healthy lifestyle.  And absolutely no smoking or even second hand smoke. Most people know smoking can cause heart and lung diseases, but did you know it also contributes to weakening of your bones? Aging of your skin?  Yellowing of your teeth and nails?  CALCIUM AND VITAMIN D:  Adequate intake of calcium and Vitamin D are recommended.  The recommendations for exact amounts of these supplements seem to change often, but generally speaking 1,000 mg of calcium (between diet and supplement) and 800 units of Vitamin D per day seems prudent. Certain women may benefit from higher intake of Vitamin D.  If you are among these women, your doctor will have told you during your visit.    PAP SMEARS:  Pap smears, to check for cervical cancer or precancers,  have traditionally been done yearly, although recent scientific advances have shown that most women can have pap smears less often.  However, every woman still should have a physical exam from her gynecologist every year. It will include a breast check, inspection of the vulva and vagina to check for abnormal growths or skin changes, a visual exam of the cervix, and then an exam to evaluate the size and shape of the uterus and  ovaries.  And after 32 years of age, a rectal exam is indicated to check for rectal cancers. We will also provide age appropriate advice regarding health maintenance, like when you should have certain vaccines, screening for sexually transmitted diseases, bone density testing, colonoscopy, mammograms, etc.   MAMMOGRAMS:  All women over 40 years old should have a yearly mammogram. Many facilities now offer a "3D" mammogram, which may cost around $50 extra out of pocket. If possible,  we recommend you accept the option to have the 3D mammogram performed.  It both reduces the number of women who will be called back for extra views which then turn out to be normal, and it is better than the routine mammogram at detecting truly abnormal areas.    COLON CANCER SCREENING: Now recommend starting at age 45. At this time colonoscopy is not covered for routine screening until 50. There are take home tests that can be done between 45-49.   COLONOSCOPY:  Colonoscopy to screen for colon cancer is recommended for all women at age 50.  We know, you hate the idea of the prep.  We agree, BUT, having colon cancer and not knowing it is worse!!  Colon cancer so often starts as a polyp that can be seen and removed at colonscopy, which can quite literally save your life!  And if your first colonoscopy is normal and you have no family history of colon cancer, most women don't have to have it again for   10 years.  Once every ten years, you can do something that may end up saving your life, right?  We will be happy to help you get it scheduled when you are ready.  Be sure to check your insurance coverage so you understand how much it will cost.  It may be covered as a preventative service at no cost, but you should check your particular policy.      Breast Self-Awareness Breast self-awareness means being familiar with how your breasts look and feel. It involves checking your breasts regularly and reporting any changes to your  health care provider. Practicing breast self-awareness is important. A change in your breasts can be a sign of a serious medical problem. Being familiar with how your breasts look and feel allows you to find any problems early, when treatment is more likely to be successful. All women should practice breast self-awareness, including women who have had breast implants. How to do a breast self-exam One way to learn what is normal for your breasts and whether your breasts are changing is to do a breast self-exam. To do a breast self-exam: Look for Changes  1. Remove all the clothing above your waist. 2. Stand in front of a mirror in a room with good lighting. 3. Put your hands on your hips. 4. Push your hands firmly downward. 5. Compare your breasts in the mirror. Look for differences between them (asymmetry), such as: ? Differences in shape. ? Differences in size. ? Puckers, dips, and bumps in one breast and not the other. 6. Look at each breast for changes in your skin, such as: ? Redness. ? Scaly areas. 7. Look for changes in your nipples, such as: ? Discharge. ? Bleeding. ? Dimpling. ? Redness. ? A change in position. Feel for Changes Carefully feel your breasts for lumps and changes. It is best to do this while lying on your back on the floor and again while sitting or standing in the shower or tub with soapy water on your skin. Feel each breast in the following way:  Place the arm on the side of the breast you are examining above your head.  Feel your breast with the other hand.  Start in the nipple area and make  inch (2 cm) overlapping circles to feel your breast. Use the pads of your three middle fingers to do this. Apply light pressure, then medium pressure, then firm pressure. The light pressure will allow you to feel the tissue closest to the skin. The medium pressure will allow you to feel the tissue that is a little deeper. The firm pressure will allow you to feel the tissue  close to the ribs.  Continue the overlapping circles, moving downward over the breast until you feel your ribs below your breast.  Move one finger-width toward the center of the body. Continue to use the  inch (2 cm) overlapping circles to feel your breast as you move slowly up toward your collarbone.  Continue the up and down exam using all three pressures until you reach your armpit.  Write Down What You Find  Write down what is normal for each breast and any changes that you find. Keep a written record with breast changes or normal findings for each breast. By writing this information down, you do not need to depend only on memory for size, tenderness, or location. Write down where you are in your menstrual cycle, if you are still menstruating. If you are having trouble noticing differences   in your breasts, do not get discouraged. With time you will become more familiar with the variations in your breasts and more comfortable with the exam. How often should I examine my breasts? Examine your breasts every month. If you are breastfeeding, the best time to examine your breasts is after a feeding or after using a breast pump. If you menstruate, the best time to examine your breasts is 5-7 days after your period is over. During your period, your breasts are lumpier, and it may be more difficult to notice changes. When should I see my health care provider? See your health care provider if you notice:  A change in shape or size of your breasts or nipples.  A change in the skin of your breast or nipples, such as a reddened or scaly area.  Unusual discharge from your nipples.  A lump or thick area that was not there before.  Pain in your breasts.  Anything that concerns you.  

## 2020-07-22 LAB — CYTOLOGY - PAP
Comment: NEGATIVE
Diagnosis: UNDETERMINED — AB
High risk HPV: NEGATIVE

## 2020-07-24 LAB — NUSWAB VAGINITIS (VG)
Atopobium vaginae: HIGH Score — AB
Candida albicans, NAA: POSITIVE — AB
Candida glabrata, NAA: NEGATIVE
Trich vag by NAA: NEGATIVE

## 2020-07-25 ENCOUNTER — Telehealth: Payer: Self-pay

## 2020-07-25 ENCOUNTER — Encounter: Payer: Self-pay | Admitting: Registered Nurse

## 2020-07-25 DIAGNOSIS — F988 Other specified behavioral and emotional disorders with onset usually occurring in childhood and adolescence: Secondary | ICD-10-CM

## 2020-07-25 NOTE — Telephone Encounter (Signed)
Left message for pt to return call to triage RN. 

## 2020-07-25 NOTE — Telephone Encounter (Signed)
-----   Message from Salvadore Dom, MD sent at 07/24/2020  1:22 PM EDT ----- Please inform the patient that her vaginitis probe was indeterminate for BV, given her symptoms I would recommend treatment. Treat with flagyl (either oral or vaginal, her choice), no ETOH while on Flagyl.  Oral: Flagyl 500 mg BID x 7 days, or Vaginal: Metrogel, 1 applicator per vagina q day x 5 days. Her vaginitis panel was also + for yeast, if symptomatic, treat with diflucan 150 mg x 1, may repeat in 72 hours if still symptomatic. #2, no refills Pap with ASCUS, negative HPV, +yeast (the yeast could make it look atypical). F/U pap in 36 months.

## 2020-07-25 NOTE — Telephone Encounter (Signed)
Pt wants refill concerta but also wants it increased to 36 mg (double her current)   Patient is requesting a refill of the following medications: Requested Prescriptions   Pending Prescriptions Disp Refills  . methylphenidate (CONCERTA) 18 MG PO CR tablet 30 tablet 0    Sig: Take 1 tablet (18 mg total) by mouth daily. Generic ok    Date of patient request: 07/25/2020 Last office visit: 05/31/2020 Date of last refill: 05/31/2020 Last refill amount: 30 Follow up time period per chart: next OV 07/26/2020

## 2020-07-26 MED ORDER — METHYLPHENIDATE HCL ER (OSM) 18 MG PO TBCR: 18.0000 mg | EXTENDED_RELEASE_TABLET | Freq: Every day | ORAL | 0 refills | Status: DC

## 2020-07-28 MED ORDER — METRONIDAZOLE 500 MG PO TABS: 500.0000 mg | ORAL_TABLET | Freq: Two times a day (BID) | ORAL | 0 refills | Status: DC

## 2020-07-28 NOTE — Telephone Encounter (Signed)
Left message for pt to return call to triage RN. 

## 2020-07-28 NOTE — Telephone Encounter (Signed)
Spoke with pt. Pt given results and recommendations per Dr Talbert Nan. Pt agreeable to take Flagyl. Pt states still having odor, denies discharge or abd cramps. Pt states took OTC  AZO yeast pills and sx have resolved at this time. Pt declines Rx for Diflucan.  Pharmacy verified. Rx Flagyl sent # 14, [id number]. ETOH precautions given.  36 recall placed.  Routing to Dr Talbert Nan for update Encounter closed.

## 2020-09-22 ENCOUNTER — Other Ambulatory Visit: Payer: Self-pay | Admitting: Registered Nurse

## 2020-09-22 DIAGNOSIS — F988 Other specified behavioral and emotional disorders with onset usually occurring in childhood and adolescence: Secondary | ICD-10-CM

## 2020-09-23 MED ORDER — METHYLPHENIDATE HCL ER (OSM) 18 MG PO TBCR: 18.0000 mg | EXTENDED_RELEASE_TABLET | Freq: Every day | ORAL | 0 refills | Status: DC

## 2020-10-21 ENCOUNTER — Other Ambulatory Visit: Payer: Self-pay | Admitting: Registered Nurse

## 2020-10-21 DIAGNOSIS — J302 Other seasonal allergic rhinitis: Secondary | ICD-10-CM

## 2020-11-15 ENCOUNTER — Other Ambulatory Visit: Payer: Self-pay | Admitting: Registered Nurse

## 2020-11-15 DIAGNOSIS — F988 Other specified behavioral and emotional disorders with onset usually occurring in childhood and adolescence: Secondary | ICD-10-CM

## 2020-11-15 NOTE — Telephone Encounter (Signed)
Medication Refill - Medication: methylphenidate (CONCERTA) 18 MG PO CR tablet   Has the patient contacted their pharmacy? Yes (Agent: If no, request that the patient contact the pharmacy for the refill.) (Agent: If yes, when and what did the pharmacy advise?)Contact PCP  Preferred Pharmacy (with phone number or street name): [id number] - [place], [place] Phone:  [phone]  Fax:  [phone]       Agent: Please be advised that RX refills may take up to 3 business days. We ask that you follow-up with your pharmacy.

## 2020-11-15 NOTE — Telephone Encounter (Signed)
Patient is requesting a refill of the following medications: Requested Prescriptions   Pending Prescriptions Disp Refills   methylphenidate (CONCERTA) 18 MG PO CR tablet 30 tablet 0    Sig: Take 1 tablet (18 mg total) by mouth daily. Generic ok    Date of patient request: 11/15/20 Last office visit: 05/31/20 Date of last refill: 09/23/20 Last refill amount: 30, 0 refills Follow up time period per chart: n/a

## 2020-11-15 NOTE — Telephone Encounter (Signed)
Requested medication (s) are due for refill today - yes  Requested medication (s) are on the active medication list -yes  Future visit scheduled -no  Last refill: 09/23/20  Notes to clinic: Request RF non delegated Rx  Requested Prescriptions  Pending Prescriptions Disp Refills   methylphenidate (CONCERTA) 18 MG PO CR tablet 30 tablet 0    Sig: Take 1 tablet (18 mg total) by mouth daily. Generic ok      Not Delegated - Psychiatry:  Stimulants/ADHD Failed - 11/15/2020  9:12 AM      Failed - This refill cannot be delegated      Failed - Urine Drug Screen completed in last 360 days      Failed - Valid encounter within last 3 months    Recent Outpatient Visits           5 months ago Seasonal allergies   [facility], NP   8 months ago Localized swelling of right upper extremity   [facility] at [facility], Lorelee Market, NP   1 year ago Acute sinusitis, recurrence not specified, unspecified location   [facility], Ranell Patrick, MD   2 years ago Acute maxillary sinusitis, recurrence not specified   [facility] at [facility], [place] D, PA-C   2 years ago Annual physical exam   [facility], Laurey Arrow, MD                    Requested Prescriptions  Pending Prescriptions Disp Refills   methylphenidate (CONCERTA) 18 MG PO CR tablet 30 tablet 0    Sig: Take 1 tablet (18 mg total) by mouth daily. Generic ok      Not Delegated - Psychiatry:  Stimulants/ADHD Failed - 11/15/2020  9:12 AM      Failed - This refill cannot be delegated      Failed - Urine Drug Screen completed in last 360 days      Failed - Valid encounter within last 3 months    Recent Outpatient Visits           5 months ago Seasonal allergies   [facility] [place], NP   8 months ago Localized swelling of right upper extremity   [facility] at [facility], Lorelee Market, NP   1 year ago Acute sinusitis, recurrence not  specified, unspecified location   [facility], Ranell Patrick, MD   2 years ago Acute maxillary sinusitis, recurrence not specified   [facility] at [facility], Reather Laurence, PA-C   2 years ago Annual physical exam   [facility], Laurey Arrow, MD

## 2020-11-16 MED ORDER — METHYLPHENIDATE HCL ER (OSM) 18 MG PO TBCR: 18.0000 mg | EXTENDED_RELEASE_TABLET | Freq: Every day | ORAL | 0 refills | Status: DC

## 2020-11-23 ENCOUNTER — Telehealth: Payer: Self-pay | Admitting: Registered Nurse

## 2020-11-23 NOTE — Telephone Encounter (Signed)
Patient called to inform us that she took a COVID test yesterday (the take home antigen test over the counter) and it was positive. Patient asked that her chart be documented.  Last dos at this office was 05/31/2020.

## 2021-01-12 ENCOUNTER — Other Ambulatory Visit: Payer: Self-pay | Admitting: Registered Nurse

## 2021-01-12 DIAGNOSIS — F988 Other specified behavioral and emotional disorders with onset usually occurring in childhood and adolescence: Secondary | ICD-10-CM

## 2021-01-12 NOTE — Telephone Encounter (Signed)
Patient is requesting a refill of the following medications: Requested Prescriptions   Pending Prescriptions Disp Refills   methylphenidate (CONCERTA) 18 MG PO CR tablet 30 tablet 0    Sig: Take 1 tablet (18 mg total) by mouth daily. Generic ok    Date of patient request: 01/12/21 Last office visit: 05/31/20 Date of last refill: 11/16/20 Last refill amount:30 Follow up time period per chart:

## 2021-01-13 MED ORDER — METHYLPHENIDATE HCL ER (OSM) 18 MG PO TBCR: 18.0000 mg | EXTENDED_RELEASE_TABLET | Freq: Every day | ORAL | 0 refills | Status: DC

## 2021-02-23 ENCOUNTER — Other Ambulatory Visit: Payer: Self-pay | Admitting: Registered Nurse

## 2021-02-23 DIAGNOSIS — F988 Other specified behavioral and emotional disorders with onset usually occurring in childhood and adolescence: Secondary | ICD-10-CM

## 2021-02-23 NOTE — Telephone Encounter (Signed)
Medication Refill - Medication: Concerta   Has the patient contacted their pharmacy? No. Pt states that she is completely out of this medication. Please advise.  (Agent: If no, request that the patient contact the pharmacy for the refill.) (Agent: If yes, when and what did the pharmacy advise?)  Preferred Pharmacy (with phone number or street name):  [id number] - [place], [place]  [address] [place] [address]  Phone: [phone] Fax: [phone]  Hours: Open 24 hours     Agent: Please be advised that RX refills may take up to 3 business days. We ask that you follow-up with your pharmacy.

## 2021-02-23 NOTE — Telephone Encounter (Signed)
Patient is requesting a refill of the following medications: Requested Prescriptions   Pending Prescriptions Disp Refills   methylphenidate (CONCERTA) 18 MG PO CR tablet 30 tablet 0    Sig: Take 1 tablet (18 mg total) by mouth daily. Generic ok    Date of patient request: 02/23/21 Last office visit: 05/31/20 Date of last refill: 01/13/21 Last refill amount: 30 Follow up time period per chart:

## 2021-02-23 NOTE — Telephone Encounter (Signed)
Requested medication (s) are due for refill today: yes  Requested medication (s) are on the active medication list: yes  Last refill:  01/12/21  Future visit scheduled: no  Notes to clinic:  not delegated    Requested Prescriptions  Pending Prescriptions Disp Refills   methylphenidate (CONCERTA) 18 MG PO CR tablet 30 tablet 0    Sig: Take 1 tablet (18 mg total) by mouth daily. Generic ok      There is no refill protocol information for this order

## 2021-02-24 MED ORDER — METHYLPHENIDATE HCL ER (OSM) 18 MG PO TBCR: 18.0000 mg | EXTENDED_RELEASE_TABLET | Freq: Every day | ORAL | 0 refills | Status: DC

## 2021-03-31 ENCOUNTER — Encounter: Payer: Self-pay | Admitting: Registered Nurse

## 2021-04-05 ENCOUNTER — Other Ambulatory Visit: Payer: Self-pay

## 2021-04-05 DIAGNOSIS — F988 Other specified behavioral and emotional disorders with onset usually occurring in childhood and adolescence: Secondary | ICD-10-CM

## 2021-04-05 MED ORDER — METHYLPHENIDATE HCL ER (OSM) 18 MG PO TBCR: 18.0000 mg | EXTENDED_RELEASE_TABLET | Freq: Every day | ORAL | 0 refills | Status: DC

## 2021-04-05 NOTE — Telephone Encounter (Signed)
Patient is requesting a refill of the following medications: Requested Prescriptions   Pending Prescriptions Disp Refills  . methylphenidate (CONCERTA) 18 MG PO CR tablet 30 tablet 0    Sig: Take 1 tablet (18 mg total) by mouth daily. Generic ok    Date of patient request: 04/05/2021 Last office visit: 05/31/2020 Date of last refill: 02/24/2021 Last refill amount:30 tablets  Follow up time period per chart:none

## 2021-04-05 NOTE — Telephone Encounter (Signed)
Medication request sent.  

## 2021-04-05 NOTE — Telephone Encounter (Signed)
This patient is requesting Concerta refill, [facility] patient. Should I send some request to [facility] or you. Thanks

## 2021-04-17 ENCOUNTER — Other Ambulatory Visit: Payer: Self-pay | Admitting: Registered Nurse

## 2021-04-17 DIAGNOSIS — J302 Other seasonal allergic rhinitis: Secondary | ICD-10-CM

## 2021-05-11 ENCOUNTER — Other Ambulatory Visit: Payer: Self-pay | Admitting: Registered Nurse

## 2021-05-11 DIAGNOSIS — F988 Other specified behavioral and emotional disorders with onset usually occurring in childhood and adolescence: Secondary | ICD-10-CM

## 2021-05-11 MED ORDER — METHYLPHENIDATE HCL ER (OSM) 18 MG PO TBCR: 18.0000 mg | EXTENDED_RELEASE_TABLET | Freq: Every day | ORAL | 0 refills | Status: DC

## 2021-05-11 NOTE — Telephone Encounter (Signed)
LFD 04/05/21 #30 with no refills LOV 05/31/20 NOV none

## 2021-06-26 ENCOUNTER — Other Ambulatory Visit: Payer: Self-pay | Admitting: Registered Nurse

## 2021-06-26 DIAGNOSIS — F988 Other specified behavioral and emotional disorders with onset usually occurring in childhood and adolescence: Secondary | ICD-10-CM

## 2021-06-26 MED ORDER — METHYLPHENIDATE HCL ER (OSM) 18 MG PO TBCR: 18.0000 mg | EXTENDED_RELEASE_TABLET | Freq: Every day | ORAL | 0 refills | Status: DC

## 2021-06-26 NOTE — Telephone Encounter (Signed)
FD 05/11/21 #30 with no refills LOV 05/31/20 NOV none

## 2021-07-25 NOTE — Progress Notes (Addendum)
35 y.o. G0P0000 Significant Other White or Caucasian Not Hispanic or Latino female here for annual exam.  She has a mirena IUD, inserted in 8/20. No bleeding. No dyspareunia.   9/19 hysteroscopy, cervical myomectomy.   She reports urgency to urinate, sometimes leaks on the way to the bathroom. Leakage varies from small to large amount. Not wearing a pad. Can leak after having one beer. Drinks ~24 oz of coffee a day.   She got engaged in July. Getting married in September of 2023.   No LMP recorded. (Menstrual status: IUD).          Sexually active: Yes.    The current method of family planning is IUD.    Exercising: Yes.     Running  Smoker:  no  Health Maintenance: Pap: 07/20/20 ASCUS Hr HPV Neg,  05-05-18 Neg; 2013 LGSIL  History of abnormal Pap:  yes MMG:  none  BMD:   none  Colonoscopy: none  TDaP:  07/13/19  Gardasil: complete    reports that she has never smoked. She has never used smokeless tobacco. She reports current alcohol use of about 5.0 standard drinks per week. She reports that she does not use drugs. She works in Pharmacologist for the [employer]   Past Medical History:  Diagnosis Date   ADD (attention deficit disorder)    Anemia    Anxiety    Asthma    grew out of it, childhood   Depression    Fatigue    Fibroid of cervix    Plantar wart of left foot 04/14/2014   Seasonal allergies    Syncope, vasovagal     Past Surgical History:  Procedure Laterality Date   HYSTEROSCOPY WITH D & C N/A 08/12/2018   Procedure: DILATATION AND CURETTAGE /HYSTEROSCOPY;  Surgeon: Salvadore Dom, MD;  Location: [facility];  Service: Gynecology;  Laterality: N/A;   NASAL SEPTUM SURGERY     PROLAPSED UTERINE FIBROID LIGATION N/A 08/12/2018   Procedure: MYOMECTOMY CERVICAL;  Surgeon: Salvadore Dom, MD;  Location[facility];  Service: Gynecology;  Laterality: N/A;   RECONSTRUCTION MANDIBLE / MAXILLA     underbite correction     Current Outpatient Medications  Medication Sig Dispense Refill   albuterol (VENTOLIN HFA) 108 (90 Base) MCG/ACT inhaler Inhale 1-2 puffs into the lungs every 6 (six) hours as needed for wheezing or shortness of breath. 18 g 0   FLUoxetine (PROZAC) 20 MG capsule Take 1 capsule (20 mg total) by mouth daily. 90 capsule 3   levonorgestrel (MIRENA) 20 MCG/24HR IUD 1 each by Intrauterine route once.     methylphenidate (CONCERTA) 18 MG PO CR tablet Take 1 tablet (18 mg total) by mouth daily. Generic ok 30 tablet 0   montelukast (SINGULAIR) 10 MG tablet TAKE 1 TABLET BY MOUTH EVERYDAY AT BEDTIME 30 tablet 3   No current facility-administered medications for this visit.    Family History  Problem Relation Age of Onset   Cancer Father        prostate   Stroke Father    Heart disease Maternal Grandmother    Hyperlipidemia Maternal Grandmother    Stroke Maternal Grandmother    Hypertension Maternal Grandmother     Review of Systems  All other systems reviewed and are negative.  Exam:   BP 124/68   Pulse 67   Ht '5\' 3"'$  (1.6 m)   Wt 156 lb (70.8 kg)   SpO2 99%   BMI  27.63 kg/m   Weight change: '@WEIGHTCHANGE'$ @ Height:   Height: '5\' 3"'$  (160 cm)  Ht Readings from Last 3 Encounters:  07/26/21 '5\' 3"'$  (1.6 m)  07/20/20 '5\' 3"'$  (1.6 m)  05/31/20 '5\' 3"'$  (1.6 m)    General appearance: alert, cooperative and appears stated age Head: Normocephalic, without obvious abnormality, atraumatic Neck: no adenopathy, supple, symmetrical, trachea midline and thyroid normal to inspection and palpation Lungs: clear to auscultation bilaterally Cardiovascular: regular rate and rhythm Breasts: normal appearance, no masses or tenderness Abdomen: soft, non-tender; non distended,  no masses,  no organomegaly Extremities: extremities normal, atraumatic, no cyanosis or edema Skin: Skin color, texture, turgor normal. No rashes or lesions Lymph nodes: Cervical, supraclavicular, and axillary nodes normal. No  abnormal inguinal nodes palpated Neurologic: Grossly normal   Pelvic: External genitalia:  no lesions              Urethra:  normal appearing urethra with no masses, tenderness or lesions              Bartholins and Skenes: normal                 Vagina: normal appearing vagina with normal color and discharge, no lesions              Cervix: no lesions and IUD string seen looping back inside of her cervix (not easily pulled out with the cytobrush). Friable with the pap.               Bimanual Exam:  Uterus:  normal size, contour, position, consistency, mobility, non-tender              Adnexa: no mass, fullness, tenderness               Rectovaginal: Confirms               Anus:  normal sphincter tone, no lesions  Gae Dry chaperoned for the exam.  1. Well woman exam Discussed breast self exam Discussed calcium and vit D intake   2. Screening for cervical cancer - Cytology - PAP  3. IUD check up Doing well  4. OAB (overactive bladder) Cut back on caffine - Urinalysis,Complete w/RFL Culture -Discussed option of PT and medication. She will call if she wants medication (side effects reviewed) -Kegel information and bladder training information given  5. Urge incontinence See above - Urinalysis,Complete w/RFL Culture  6. Laboratory exam ordered as part of routine general medical examination Normal lipids last year - CBC - Comprehensive metabolic panel   Addendum: Prozac refilled

## 2021-07-26 ENCOUNTER — Other Ambulatory Visit: Payer: Self-pay

## 2021-07-26 ENCOUNTER — Ambulatory Visit (INDEPENDENT_AMBULATORY_CARE_PROVIDER_SITE_OTHER): Payer: 59 | Admitting: Obstetrics and Gynecology

## 2021-07-26 ENCOUNTER — Other Ambulatory Visit (HOSPITAL_COMMUNITY)
Admission: RE | Admit: 2021-07-26 | Discharge: 2021-07-26 | Disposition: A | Discharge status: Home / Self Care | Payer: 59 | Source: Ambulatory Visit | Attending: Obstetrics and Gynecology | Admitting: Obstetrics and Gynecology

## 2021-07-26 ENCOUNTER — Encounter: Payer: Self-pay | Admitting: Obstetrics and Gynecology

## 2021-07-26 VITALS — BP 124/68 | HR 67 | Ht 63.0 in | Wt 156.0 lb

## 2021-07-26 DIAGNOSIS — Z01419 Encounter for gynecological examination (general) (routine) without abnormal findings: Secondary | ICD-10-CM

## 2021-07-26 DIAGNOSIS — Z124 Encounter for screening for malignant neoplasm of cervix: Secondary | ICD-10-CM | POA: Insufficient documentation

## 2021-07-26 DIAGNOSIS — Z0000 Encounter for general adult medical examination without abnormal findings: Secondary | ICD-10-CM

## 2021-07-26 DIAGNOSIS — N3281 Overactive bladder: Secondary | ICD-10-CM | POA: Diagnosis not present

## 2021-07-26 DIAGNOSIS — N3941 Urge incontinence: Secondary | ICD-10-CM | POA: Diagnosis not present

## 2021-07-26 DIAGNOSIS — Z30431 Encounter for routine checking of intrauterine contraceptive device: Secondary | ICD-10-CM | POA: Diagnosis not present

## 2021-07-26 DIAGNOSIS — F32A Depression, unspecified: Secondary | ICD-10-CM

## 2021-07-26 DIAGNOSIS — F419 Anxiety disorder, unspecified: Secondary | ICD-10-CM

## 2021-07-26 LAB — HM PAP SMEAR

## 2021-07-26 LAB — URINALYSIS, COMPLETE W/RFL CULTURE
Bacteria, UA: NONE SEEN /HPF
Bilirubin Urine: NEGATIVE
Glucose, UA: NEGATIVE
Hyaline Cast: NONE SEEN /LPF
Ketones, ur: NEGATIVE
Leukocyte Esterase: NEGATIVE
Nitrites, Initial: NEGATIVE
Protein, ur: NEGATIVE
RBC / HPF: NONE SEEN /HPF (ref 0–2)
Specific Gravity, Urine: 1.004 (ref 1.001–1.035)
WBC, UA: NONE SEEN /HPF (ref 0–5)
pH: 5.5 (ref 5.0–8.0)

## 2021-07-26 LAB — NO CULTURE INDICATED

## 2021-07-26 LAB — RESULTS CONSOLE HPV: CHL HPV: NEGATIVE

## 2021-07-26 MED ORDER — FLUOXETINE HCL 20 MG PO CAPS: 20.0000 mg | ORAL_CAPSULE | Freq: Every day | ORAL | 3 refills | Status: DC

## 2021-07-26 NOTE — Addendum Note (Signed)
Addended by: Dorothy Spark on: 07/26/2021 10:52 AM   Modules accepted: Orders

## 2021-07-26 NOTE — Patient Instructions (Signed)
EXERCISE   We recommended that you start or continue a regular exercise program for good health. Physical activity is anything that gets your body moving, some is better than none. The CDC recommends 150 minutes per week of Moderate-Intensity Aerobic Activity and 2 or more days of Muscle Strengthening Activity.  Benefits of exercise are limitless: helps weight loss/weight maintenance, improves mood and energy, helps with depression and anxiety, improves sleep, tones and strengthens muscles, improves balance, improves bone density, protects from chronic conditions such as heart disease, high blood pressure and diabetes and so much more. To learn more visit: https://www.cdc.gov/physicalactivity/index.html  DIET: Good nutrition starts with a healthy diet of fruits, vegetables, whole grains, and lean protein sources. Drink plenty of water for hydration. Minimize empty calories, sodium, sweets. For more information about dietary recommendations visit: https://health.gov/our-work/nutrition-physical-activity/dietary-guidelines and https://www.myplate.gov/  ALCOHOL:  Women should limit their alcohol intake to no more than 7 drinks/beers/glasses of wine (combined, not each!) per week. Moderation of alcohol intake to this level decreases your risk of breast cancer and liver damage.  If you are concerned that you may have a problem, or your friends have told you they are concerned about your drinking, there are many resources to help. A well-known program that is free, effective, and available to all people all over the nation is Alcoholics Anonymous.  Check out this site to learn more: https://www.aa.org/   CALCIUM AND VITAMIN D:  Adequate intake of calcium and Vitamin D are recommended for bone health.  You should be getting between 1000-1200 mg of calcium and 800 units of Vitamin D daily between diet and supplements  PAP SMEARS:  Pap smears, to check for cervical cancer or precancers,  have traditionally been  done yearly, scientific advances have shown that most women can have pap smears less often.  However, every woman still should have a physical exam from her gynecologist every year. It will include a breast check, inspection of the vulva and vagina to check for abnormal growths or skin changes, a visual exam of the cervix, and then an exam to evaluate the size and shape of the uterus and ovaries. We will also provide age appropriate advice regarding health maintenance, like when you should have certain vaccines, screening for sexually transmitted diseases, bone density testing, colonoscopy, mammograms, etc.   MAMMOGRAMS:  All women over 40 years old should have a routine mammogram.   COLON CANCER SCREENING: Now recommend starting at age 45. At this time colonoscopy is not covered for routine screening until 50. There are take home tests that can be done between 45-49.   COLONOSCOPY:  Colonoscopy to screen for colon cancer is recommended for all women at age 50.  We know, you hate the idea of the prep.  We agree, BUT, having colon cancer and not knowing it is worse!!  Colon cancer so often starts as a polyp that can be seen and removed at colonscopy, which can quite literally save your life!  And if your first colonoscopy is normal and you have no family history of colon cancer, most women don't have to have it again for 10 years.  Once every ten years, you can do something that may end up saving your life, right?  We will be happy to help you get it scheduled when you are ready.  Be sure to check your insurance coverage so you understand how much it will cost.  It may be covered as a preventative service at no cost, but you should check   your particular policy.      Breast Self-Awareness Breast self-awareness means being familiar with how your breasts look and feel. It involves checking your breasts regularly and reporting any changes to your health care provider. Practicing breast self-awareness is  important. A change in your breasts can be a sign of a serious medical problem. Being familiar with how your breasts look and feel allows you to find any problems early, when treatment is more likely to be successful. All women should practice breast self-awareness, including women who have had breast implants. How to do a breast self-exam One way to learn what is normal for your breasts and whether your breasts are changing is to do a breast self-exam. To do a breast self-exam: Look for Changes  Remove all the clothing above your waist. Stand in front of a mirror in a room with good lighting. Put your hands on your hips. Push your hands firmly downward. Compare your breasts in the mirror. Look for differences between them (asymmetry), such as: Differences in shape. Differences in size. Puckers, dips, and bumps in one breast and not the other. Look at each breast for changes in your skin, such as: Redness. Scaly areas. Look for changes in your nipples, such as: Discharge. Bleeding. Dimpling. Redness. A change in position. Feel for Changes Carefully feel your breasts for lumps and changes. It is best to do this while lying on your back on the floor and again while sitting or standing in the shower or tub with soapy water on your skin. Feel each breast in the following way: Place the arm on the side of the breast you are examining above your head. Feel your breast with the other hand. Start in the nipple area and make  inch (2 cm) overlapping circles to feel your breast. Use the pads of your three middle fingers to do this. Apply light pressure, then medium pressure, then firm pressure. The light pressure will allow you to feel the tissue closest to the skin. The medium pressure will allow you to feel the tissue that is a little deeper. The firm pressure will allow you to feel the tissue close to the ribs. Continue the overlapping circles, moving downward over the breast until you feel your  ribs below your breast. Move one finger-width toward the center of the body. Continue to use the  inch (2 cm) overlapping circles to feel your breast as you move slowly up toward your collarbone. Continue the up and down exam using all three pressures until you reach your armpit.  Write Down What You Find  Write down what is normal for each breast and any changes that you find. Keep a written record with breast changes or normal findings for each breast. By writing this information down, you do not need to depend only on memory for size, tenderness, or location. Write down where you are in your menstrual cycle, if you are still menstruating. If you are having trouble noticing differences in your breasts, do not get discouraged. With time you will become more familiar with the variations in your breasts and more comfortable with the exam. How often should I examine my breasts? Examine your breasts every month. If you are breastfeeding, the best time to examine your breasts is after a feeding or after using a breast pump. If you menstruate, the best time to examine your breasts is 5-7 days after your period is over. During your period, your breasts are lumpier, and it may be more   difficult to notice changes. When should I see my health care provider? See your health care provider if you notice: A change in shape or size of your breasts or nipples. A change in the skin of your breast or nipples, such as a reddened or scaly area. Unusual discharge from your nipples. A lump or thick area that was not there before. Pain in your breasts. Anything that concerns you. Urinary Incontinence  Urinary incontinence refers to a condition in which a person is unable to control where and when to pass urine. A person with this condition will urinate when he or she does not mean to (involuntarily). What are the causes? This condition may be caused by: Medicines. Infections. Constipation. Overactive bladder  muscles. Weak bladder muscles. Weak pelvic floor muscles. These muscles provide support for the bladder, intestine, and, in women, the uterus. Enlarged prostate in men. The prostate is a gland near the bladder. When it gets too big, it can pinch the urethra. With the urethra blocked, the bladder can weaken and lose the ability to empty properly. Surgery. Emotional factors, such as anxiety, stress, or post-traumatic stress disorder (PTSD). Pelvic organ prolapse. This happens in women when organs shift out of place and into the vagina. This shift can prevent the bladder and urethra from working properly. What increases the risk? The following factors may make you more likely to develop this condition: Older age. Obesity and physical inactivity. Pregnancy and childbirth. Menopause. Diseases that affect the nerves or spinal cord (neurological diseases). Long-term (chronic) coughing. This can increase pressure on the bladder and pelvic floor muscles. What are the signs or symptoms? Symptoms may vary depending on the type of urinary incontinence you have. They include: A sudden urge to urinate, but passing urine involuntarily before you can get to a bathroom (urge incontinence). Suddenly passing urine with any activity that forces urine to pass, such as coughing, laughing, exercise, or sneezing (stress incontinence). Needing to urinate often, but urinating only a small amount, or constantly dribbling urine (overflow incontinence). Urinating because you cannot get to the bathroom in time due to a physical disability, such as arthritis or injury, or communication and thinking problems, such as Alzheimer disease (functional incontinence). How is this diagnosed? This condition may be diagnosed based on: Your medical history. A physical exam. Tests, such as: Urine tests. X-rays of your kidney and bladder. Ultrasound. CT scan. Cystoscopy. In this procedure, a health care provider inserts a tube  with a light and camera (cystoscope) through the urethra and into the bladder in order to check for problems. Urodynamic testing. These tests assess how well the bladder, urethra, and sphincter can store and release urine. There are different types of urodynamic tests, and they vary depending on what the test is measuring. To help diagnose your condition, your health care provider may recommend thatyou keep a log of when you urinate and how much you urinate. How is this treated? Treatment for this condition depends on the type of incontinence that you have and its cause. Treatment may include: Lifestyle changes, such as: Quitting smoking. Maintaining a healthy weight. Staying active. Try to get 150 minutes of moderate-intensity exercise every week. Ask your health care provider which activities are safe for you. Eating a healthy diet. Avoid high-fat foods, like fried foods. Avoid refined carbohydrates like white bread and white rice. Limit how much alcohol and caffeine you drink. Increase your fiber intake. Foods such as fresh fruits, vegetables, beans, and whole grains are healthy sources of  fiber. Pelvic floor muscle exercises. Bladder training, such as lengthening the amount of time between bathroom breaks, or using the bathroom at regular intervals. Using techniques to suppress bladder urges. This can include distraction techniques or controlled breathing exercises. Medicines to relax the bladder muscles and prevent bladder spasms. Medicines to help slow or prevent the growth of a man's prostate. Botox injections. These can help relax the bladder muscles. Using pulses of electricity to help change bladder reflexes (electrical nerve stimulation). For women, using a medical device to prevent urine leaks. This is a small, tampon-like, disposable device that is inserted into the urethra. Injecting collagen or carbon beads (bulking agents) into the urinary sphincter. These can help thicken  tissue and close the bladder opening. Surgery. Follow these instructions at home: Lifestyle Limit alcohol and caffeine. These can fill your bladder quickly and irritate it. Keep yourself clean to help prevent odors and skin damage. Ask your doctor about special skin creams and cleansers that can protect the skin from urine. Consider wearing pads or adult diapers. Make sure to change them regularly, and always change them right after experiencing incontinence. General instructions Take over-the-counter and prescription medicines only as told by your health care provider. Use the bathroom about every 3-4 hours, even if you do not feel the need to urinate. Try to empty your bladder completely every time. After urinating, wait a minute. Then try to urinate again. Make sure you are in a relaxed position while urinating. If your incontinence is caused by nerve problems, keep a log of the medicines you take and the times you go to the bathroom. Keep all follow-up visits as told by your health care provider. This is important. Contact a health care provider if: You have pain that gets worse. Your incontinence gets worse. Get help right away if: You have a fever or chills. You are unable to urinate. You have redness in your groin area or down your legs. Summary Urinary incontinence refers to a condition in which a person is unable to control where and when to pass urine. This condition may be caused by medicines, infection, weak bladder muscles, weak pelvic floor muscles, enlargement of the prostate (in men), or surgery. The following factors increase your risk for developing this condition: older age, obesity, pregnancy and childbirth, menopause, neurological diseases, and chronic coughing. There are several types of urinary incontinence. They include urge incontinence, stress incontinence, overflow incontinence, and functional incontinence. This condition is usually treated first with lifestyle and  behavioral changes, such as quitting smoking, eating a healthier diet, and doing regular pelvic floor exercises. Other treatment options include medicines, bulking agents, medical devices, electrical nerve stimulation, or surgery. This information is not intended to replace advice given to you by your health care provider. Make sure you discuss any questions you have with your healthcare provider. Document Revised: 11/29/2017 Document Reviewed: 02/28/2017 Elsevier Patient Education  [place]. Kegel Exercises  Kegel exercises can help strengthen your pelvic floor muscles. The pelvic floor is a group of muscles that support your rectum, small intestine, and bladder. In females, pelvic floor muscles also help support the womb (uterus). These muscles help you control the flow of urine and stool. Kegel exercises are painless and simple, and they do not require any equipment. Your provider may suggest Kegel exercises to: Improve bladder and bowel control. Improve sexual response. Improve weak pelvic floor muscles after surgery to remove the uterus (hysterectomy) or pregnancy (females). Improve weak pelvic floor muscles after prostate gland  removal or surgery (males). Kegel exercises involve squeezing your pelvic floor muscles, which are the same muscles you squeeze when you try to stop the flow of urine or keep from passing gas. The exercises can be done while sitting, standing, or lying down, but itis best to vary your position. Exercises How to do Kegel exercises: Squeeze your pelvic floor muscles tight. You should feel a tight lift in your rectal area. If you are a female, you should also feel a tightness in your vaginal area. Keep your stomach, buttocks, and legs relaxed. Hold the muscles tight for up to 10 seconds. Breathe normally. Relax your muscles. Repeat as told by your health care provider. Repeat this exercise daily as told by your health care provider. Continue to do this  exercise for at least 4-6 weeks, or for as long as told by your healthcare provider. You may be referred to a physical therapist who can help you learn more abouthow to do Kegel exercises. Depending on your condition, your health care provider may recommend: Varying how long you squeeze your muscles. Doing several sets of exercises every day. Doing exercises for several weeks. Making Kegel exercises a part of your regular exercise routine. This information is not intended to replace advice given to you by your health care provider. Make sure you discuss any questions you have with your healthcare provider. Document Revised: 11/09/2020 Document Reviewed: 07/09/2018 Elsevier Patient Education  [place].

## 2021-07-27 LAB — COMPREHENSIVE METABOLIC PANEL
AG Ratio: 1.8 (calc) (ref 1.0–2.5)
ALT: 24 U/L (ref 6–29)
AST: 16 U/L (ref 10–30)
Albumin: 4.4 g/dL (ref 3.6–5.1)
Alkaline phosphatase (APISO): 49 U/L (ref 31–125)
BUN: 23 mg/dL (ref 7–25)
CO2: 27 mmol/L (ref 20–32)
Calcium: 9.2 mg/dL (ref 8.6–10.2)
Chloride: 105 mmol/L (ref 98–110)
Creat: 0.91 mg/dL (ref 0.50–0.97)
Globulin: 2.5 g/dL (calc) (ref 1.9–3.7)
Glucose, Bld: 75 mg/dL (ref 65–99)
Potassium: 4.5 mmol/L (ref 3.5–5.3)
Sodium: 137 mmol/L (ref 135–146)
Total Bilirubin: 0.5 mg/dL (ref 0.2–1.2)
Total Protein: 6.9 g/dL (ref 6.1–8.1)

## 2021-07-27 LAB — CBC
HCT: 45.4 % — ABNORMAL HIGH (ref 35.0–45.0)
Hemoglobin: 14.6 g/dL (ref 11.7–15.5)
MCH: 30 pg (ref 27.0–33.0)
MCHC: 32.2 g/dL (ref 32.0–36.0)
MCV: 93.4 fL (ref 80.0–100.0)
MPV: 11.9 fL (ref 7.5–12.5)
Platelets: 166 10*3/uL (ref 140–400)
RBC: 4.86 10*6/uL (ref 3.80–5.10)
RDW: 11.8 % (ref 11.0–15.0)
WBC: 5.4 10*3/uL (ref 3.8–10.8)

## 2021-07-31 LAB — CYTOLOGY - PAP
Comment: NEGATIVE
Diagnosis: NEGATIVE
High risk HPV: NEGATIVE

## 2021-08-02 ENCOUNTER — Other Ambulatory Visit: Payer: Self-pay | Admitting: Registered Nurse

## 2021-08-02 DIAGNOSIS — F988 Other specified behavioral and emotional disorders with onset usually occurring in childhood and adolescence: Secondary | ICD-10-CM

## 2021-08-02 NOTE — Telephone Encounter (Signed)
LFD 06/26/21 #30 with no refills LOV 05/31/20 NOV none

## 2021-08-03 MED ORDER — METHYLPHENIDATE HCL ER (OSM) 18 MG PO TBCR: 18.0000 mg | EXTENDED_RELEASE_TABLET | Freq: Every day | ORAL | 0 refills | Status: DC

## 2021-08-25 ENCOUNTER — Encounter: Payer: Self-pay | Admitting: Obstetrics and Gynecology

## 2021-08-25 NOTE — Telephone Encounter (Signed)
Per Dr. Quincy Simmonds patient needs appointment. [facility] appointment pool will call and schedule OV

## 2021-08-25 NOTE — Telephone Encounter (Signed)
Patient complains of vaginal discharge after intercourse, urinary frequency, fishy odor. Pt would like to be treated over the phone versus office visit. Patient aware Provider may recommend office visit. I will route to Provider for recommendations.

## 2021-08-29 ENCOUNTER — Encounter: Payer: Self-pay | Admitting: Obstetrics and Gynecology

## 2021-08-29 ENCOUNTER — Ambulatory Visit: Payer: 59 | Admitting: Obstetrics and Gynecology

## 2021-08-29 ENCOUNTER — Other Ambulatory Visit: Payer: Self-pay

## 2021-08-29 VITALS — BP 100/70 | HR 74 | Ht 63.0 in | Wt 157.0 lb

## 2021-08-29 DIAGNOSIS — N761 Subacute and chronic vaginitis: Secondary | ICD-10-CM | POA: Diagnosis not present

## 2021-08-29 DIAGNOSIS — N760 Acute vaginitis: Secondary | ICD-10-CM | POA: Diagnosis not present

## 2021-08-29 DIAGNOSIS — B9689 Other specified bacterial agents as the cause of diseases classified elsewhere: Secondary | ICD-10-CM

## 2021-08-29 LAB — WET PREP FOR TRICH, YEAST, CLUE

## 2021-08-29 MED ORDER — METRONIDAZOLE 500 MG PO TABS: 500.0000 mg | ORAL_TABLET | Freq: Two times a day (BID) | ORAL | 0 refills | Status: DC

## 2021-08-29 NOTE — Patient Instructions (Addendum)
Bacterial Vaginosis °Bacterial vaginosis is an infection that occurs when the normal balance of bacteria in the vagina changes. This change is caused by an overgrowth of certain bacteria in the vagina. Bacterial vaginosis is the most common vaginal infection among females aged 33 to 44 years. °This condition increases the risk of sexually transmitted infections (STIs). Treatment can help reduce this risk. Treatment is very important for pregnant women because this condition can cause babies to be born early (prematurely) or at a low birth weight. °What are the causes? °This condition is caused by an increase in harmful bacteria that are normally present in small amounts in the vagina. However, the exact reason this condition develops is not known. °You cannot get bacterial vaginosis from toilet seats, bedding, swimming pools, or contact with objects around you. °What increases the risk? °The following factors may make you more likely to develop this condition: °Having a new sexual partner or multiple sexual partners, or having unprotected sex. °Douching. °Having an intrauterine device (IUD). °Smoking. °Abusing drugs and alcohol. This may lead to riskier sexual behavior. °Taking certain antibiotic medicines. °Being pregnant. °What are the signs or symptoms? °Some women with this condition have no symptoms. Symptoms may include: °Gray or white vaginal discharge. The discharge can be watery or foamy. °A fish-like odor with discharge, especially after sex or during menstruation. °Itching in and around the vagina. °Burning or pain with urination. °How is this diagnosed? °This condition is diagnosed based on: °Your medical history. °A physical exam of the vagina. °Checking a sample of vaginal fluid for harmful bacteria or abnormal cells. °How is this treated? °This condition is treated with antibiotic medicines. These may be given as a pill, a vaginal cream, or a medicine that is put into the vagina (suppository). If the  condition comes back after treatment, a second round of antibiotics may be needed. °Follow these instructions at home: °Medicines °Take or apply over-the-counter and prescription medicines only as told by your health care provider. °Take or apply your antibiotic medicine as told by your health care provider. Do not stop using the antibiotic even if you start to feel better. °General instructions °If you have a female sexual partner, tell her that you have a vaginal infection. She should follow up with her health care provider. If you have a female sexual partner, he does not need treatment. °Avoid sexual activity until you finish treatment. °Drink enough fluid to keep your urine pale yellow. °Keep the area around your vagina and rectum clean. °Wash the area daily with warm water. °Wipe yourself from front to back after using the toilet. °If you are breastfeeding, talk to your health care provider about continuing breastfeeding during treatment. °Keep all follow-up visits. This is important. °How is this prevented? °Self-care °Do not douche. °Wash the outside of your vagina with warm water only. °Wear cotton or cotton-lined underwear. °Avoid wearing tight pants and pantyhose, especially during the summer. °Safe sex °Use protection when having sex. This includes: °Using condoms. °Using dental dams. This is a thin layer of a material made of latex or polyurethane that protects the mouth during oral sex. °Limit the number of sexual partners. To help prevent bacterial vaginosis, it is best to have sex with just one partner (monogamous relationship). °Make sure you and your sexual partner are tested for STIs. °Drugs and alcohol °Do not use any products that contain nicotine or tobacco. These products include cigarettes, chewing tobacco, and vaping devices, such as e-cigarettes. If you need help quitting,   ask your health care provider. °Do not use drugs. °Do not drink alcohol if: °Your health care provider tells you not to  do this. °You are pregnant, may be pregnant, or are planning to become pregnant. °If you drink alcohol: °Limit how much you have to 0-1 drink a day. °Be aware of how much alcohol is in your drink. In the [place] one drink equals one 12 oz bottle of beer (355 mL), one 5 oz glass of wine (148 mL), or one 1½ oz glass of hard liquor (44 mL). °Where to find more information °Centers for Disease Control and Prevention: www.cdc.gov °American Sexual Health Association (ASHA): www.ashastd.org °[place] Department of Health and Human Services, Office on Women's Health: www.womenshealth.gov °Contact a health care provider if: °Your symptoms do not improve, even after treatment. °You have more discharge or pain when urinating. °You have a fever or chills. °You have pain in your abdomen or pelvis. °You have pain during sex. °You have vaginal bleeding between menstrual periods. °Summary °Bacterial vaginosis is a vaginal infection that occurs when the normal balance of bacteria in the vagina changes. It results from an overgrowth of certain bacteria. °This condition increases the risk of sexually transmitted infections (STIs). Getting treated can help reduce this risk. °Treatment is very important for pregnant women because this condition can cause babies to be born early (prematurely) or at low birth weight. °This condition is treated with antibiotic medicines. These may be given as a pill, a vaginal cream, or a medicine that is put into the vagina (suppository). °This information is not intended to replace advice given to you by your health care provider. Make sure you discuss any questions you have with your health care provider. °Document Revised: 05/19/2020 Document Reviewed: 05/19/2020 °Elsevier Patient Education © 2022 [employer] ° °

## 2021-08-29 NOTE — Progress Notes (Signed)
GYNECOLOGY  VISIT   HPI: 35 y.o.   Significant Other White or Caucasian Not Hispanic or Latino  female   [id number] with No LMP recorded. (Menstrual status: IUD).   here for  slight discharge and smell.  She has a white vaginal d/c, not sure if thin or thick. She has an intermittent odor, worse after sex. Symptoms started months ago, they come and go.  GYNECOLOGIC HISTORY: No LMP recorded. (Menstrual status: IUD). Contraception:IUD  Menopausal hormone therapy: none         OB History     Gravida  0   Para  0   Term  0   Preterm  0   AB  0   Living  0      SAB  0   IAB  0   Ectopic  0   Multiple  0   Live Births                 Patient Active Problem List   Diagnosis Date Noted   Localized swelling of right upper extremity 02/23/2020   Iron deficiency anemia 09/01/2012   Exercise-induced asthma 09/01/2012   Depression    ADD (attention deficit disorder)     Past Medical History:  Diagnosis Date   ADD (attention deficit disorder)    Anemia    Anxiety    Asthma    grew out of it, childhood   Depression    Fatigue    Fibroid of cervix    Plantar wart of left foot 04/14/2014   Seasonal allergies    Syncope, vasovagal     Past Surgical History:  Procedure Laterality Date   HYSTEROSCOPY WITH D & C N/A 08/12/2018   Procedure: DILATATION AND CURETTAGE /HYSTEROSCOPY;  Surgeon: Salvadore Dom, MD;  Location: [facility];  Service: Gynecology;  Laterality: N/A;   NASAL SEPTUM SURGERY     PROLAPSED UTERINE FIBROID LIGATION N/A 08/12/2018   Procedure: MYOMECTOMY CERVICAL;  Surgeon: Salvadore Dom, MD;  Location: [facility];  Service: Gynecology;  Laterality: N/A;   RECONSTRUCTION MANDIBLE / MAXILLA     underbite correction    Current Outpatient Medications  Medication Sig Dispense Refill   albuterol (VENTOLIN HFA) 108 (90 Base) MCG/ACT inhaler Inhale 1-2 puffs into the lungs every 6 (six) hours as needed for  wheezing or shortness of breath. 18 g 0   FLUoxetine (PROZAC) 20 MG capsule Take 1 capsule (20 mg total) by mouth daily. 90 capsule 3   levonorgestrel (MIRENA) 20 MCG/24HR IUD 1 each by Intrauterine route once.     methylphenidate (CONCERTA) 18 MG PO CR tablet Take 1 tablet (18 mg total) by mouth daily. Generic ok 30 tablet 0   montelukast (SINGULAIR) 10 MG tablet TAKE 1 TABLET BY MOUTH EVERYDAY AT BEDTIME 30 tablet 3   No current facility-administered medications for this visit.     ALLERGIES: Patient has no known allergies.  Family History  Problem Relation Age of Onset   Cancer Father        prostate   Stroke Father    Heart disease Maternal Grandmother    Hyperlipidemia Maternal Grandmother    Stroke Maternal Grandmother    Hypertension Maternal Grandmother     Social History   Socioeconomic History   Marital status: Significant Other    Spouse name: n/a   Number of children: 0   Years of education: 15+   Highest education level: Not on file  Occupational History   Occupation: Designer, multimedia    Comment: [employer]  Tobacco Use   Smoking status: Never   Smokeless tobacco: Never  Vaping Use   Vaping Use: Never used  Substance and Sexual Activity   Alcohol use: Yes    Alcohol/week: 5.0 standard drinks    Types: 5 Cans of beer per week   Drug use: No   Sexual activity: Yes    Partners: Male    Birth control/protection: I.U.D.    Comment: Mirena inserted 07/21/19  Other Topics Concern   Not on file  Social History Narrative   2 sisters   Brother    Lives with partner   Social Determinants of Health   Financial Resource Strain: Not on file  Food Insecurity: Not on file  Transportation Needs: Not on file  Physical Activity: Not on file  Stress: Not on file  Social Connections: Not on file  Intimate Partner Violence: Not on file    Review of Systems  All other systems reviewed and are negative.  PHYSICAL EXAMINATION:    BP 100/70   Pulse  74   Ht 5\' 3"  (1.6 m)   Wt 157 lb (71.2 kg)   SpO2 100%   BMI 27.81 kg/m     General appearance: alert, cooperative and appears stated age  Pelvic: External genitalia:  no lesions              Urethra:  normal appearing urethra with no masses, tenderness or lesions              Bartholins and Skenes: normal                 Vagina: normal appearing vagina with an increase in watery/creamy, white vaginal discharge              Cervix: no lesions and IUD string seen               Chaperone was present for exam.  1. Subacute vaginitis - WET PREP FOR TRICH, YEAST, CLUE: + BV  2. Bacterial vaginitis - metroNIDAZOLE (FLAGYL) 500 MG tablet; Take 1 tablet (500 mg total) by mouth 2 (two) times daily.  Dispense: 14 tablet; Refill: 0 -Aware no ETOH with flagyl

## 2021-09-19 ENCOUNTER — Other Ambulatory Visit: Payer: Self-pay | Admitting: Registered Nurse

## 2021-09-19 ENCOUNTER — Encounter: Payer: Self-pay | Admitting: Registered Nurse

## 2021-09-19 DIAGNOSIS — F988 Other specified behavioral and emotional disorders with onset usually occurring in childhood and adolescence: Secondary | ICD-10-CM

## 2021-09-20 NOTE — Telephone Encounter (Signed)
Patient is requesting a refill of the following medications: Requested Prescriptions   Pending Prescriptions Disp Refills   methylphenidate (CONCERTA) 18 MG PO CR tablet 30 tablet 0    Sig: Take 1 tablet (18 mg total) by mouth daily. Generic ok    Date of patient request: 09/19/2021 Last office visit: 05/31/2020 Date of last refill: 08/03/2021 Last refill amount:  Follow up time period per chart: n/A

## 2021-09-21 MED ORDER — METHYLPHENIDATE HCL ER (OSM) 18 MG PO TBCR: 18.0000 mg | EXTENDED_RELEASE_TABLET | Freq: Every day | ORAL | 0 refills | Status: DC

## 2021-10-30 ENCOUNTER — Other Ambulatory Visit: Payer: Self-pay | Admitting: Registered Nurse

## 2021-10-30 DIAGNOSIS — F988 Other specified behavioral and emotional disorders with onset usually occurring in childhood and adolescence: Secondary | ICD-10-CM

## 2021-11-02 NOTE — Telephone Encounter (Signed)
Patient has scheduled a appointment for 11/10/2021 for medication refill

## 2021-11-03 MED ORDER — METHYLPHENIDATE HCL ER (OSM) 18 MG PO TBCR: 18.0000 mg | EXTENDED_RELEASE_TABLET | Freq: Every day | ORAL | 0 refills | Status: DC

## 2021-11-10 ENCOUNTER — Other Ambulatory Visit: Payer: Self-pay

## 2021-11-10 ENCOUNTER — Ambulatory Visit (INDEPENDENT_AMBULATORY_CARE_PROVIDER_SITE_OTHER): Payer: 59 | Admitting: Registered Nurse

## 2021-11-10 ENCOUNTER — Encounter: Payer: Self-pay | Admitting: Registered Nurse

## 2021-11-10 VITALS — BP 118/81 | HR 64 | Temp 98.1°F | Resp 17 | Ht 63.0 in | Wt 159.6 lb

## 2021-11-10 DIAGNOSIS — F32A Depression, unspecified: Secondary | ICD-10-CM

## 2021-11-10 DIAGNOSIS — F419 Anxiety disorder, unspecified: Secondary | ICD-10-CM | POA: Diagnosis not present

## 2021-11-10 DIAGNOSIS — J302 Other seasonal allergic rhinitis: Secondary | ICD-10-CM | POA: Diagnosis not present

## 2021-11-10 DIAGNOSIS — F988 Other specified behavioral and emotional disorders with onset usually occurring in childhood and adolescence: Secondary | ICD-10-CM

## 2021-11-10 MED ORDER — ALBUTEROL SULFATE HFA 108 (90 BASE) MCG/ACT IN AERS: 1.0000 | INHALATION_SPRAY | Freq: Four times a day (QID) | RESPIRATORY_TRACT | 3 refills | Status: DC | PRN

## 2021-11-10 MED ORDER — MONTELUKAST SODIUM 10 MG PO TABS: 10.0000 mg | ORAL_TABLET | Freq: Every day | ORAL | 3 refills | Status: DC

## 2021-11-10 MED ORDER — FLUOXETINE HCL 20 MG PO CAPS: 20.0000 mg | ORAL_CAPSULE | Freq: Every day | ORAL | 3 refills | Status: DC

## 2021-11-10 MED ORDER — METHYLPHENIDATE HCL ER 27 MG PO TB24: 27.0000 mg | ORAL_TABLET | Freq: Every day | ORAL | 0 refills | Status: DC

## 2021-11-10 NOTE — Progress Notes (Signed)
Established Patient Office Visit  Subjective:  Patient ID: Christy Alvarado, female    DOB: 05/18/1986  Age: 35 y.o. MRN: [id number]  CC:  Chief Complaint  Patient presents with   Medication Refill    Patient states she is here for a medication refill and a possible referral.    HPI Christy Alvarado presents for med refill, referral.  ADD On concerta 18mg  CR po qd Feels subtherapeutic - not lasting past 2pm Interested in dose changes vs supplemental medication  Referral No known food allergies, but suspects pet dander and other environmental allergies. Gets sick every fall with some cough, suspects allergies. No fever, chills, malaise. Negative COVID test.  Takes xyzal daily, singulair nightly. Albuterol prn.  Past Medical History:  Diagnosis Date   ADD (attention deficit disorder)    Anemia    Anxiety    Asthma    grew out of it, childhood   Depression    Fatigue    Fibroid of cervix    Plantar wart of left foot 04/14/2014   Seasonal allergies    Syncope, vasovagal     Past Surgical History:  Procedure Laterality Date   HYSTEROSCOPY WITH D & C N/A 08/12/2018   Procedure: DILATATION AND CURETTAGE /HYSTEROSCOPY;  Surgeon: Salvadore Dom, MD;  Location: [facility];  Service: Gynecology;  Laterality: N/A;   NASAL SEPTUM SURGERY     PROLAPSED UTERINE FIBROID LIGATION N/A 08/12/2018   Procedure: MYOMECTOMY CERVICAL;  Surgeon: Salvadore Dom, MD;  Location: [facility];  Service: Gynecology;  Laterality: N/A;   RECONSTRUCTION MANDIBLE / MAXILLA     underbite correction    Family History  Problem Relation Age of Onset   Cancer Father        prostate   Stroke Father    Heart disease Maternal Grandmother    Hyperlipidemia Maternal Grandmother    Stroke Maternal Grandmother    Hypertension Maternal Grandmother     Social History   Socioeconomic History   Marital status: Significant Other    Spouse name: n/a   Number of  children: 0   Years of education: 15+   Highest education level: Not on file  Occupational History   Occupation: Designer, multimedia    Comment: [employer]  Tobacco Use   Smoking status: Never   Smokeless tobacco: Never  Vaping Use   Vaping Use: Never used  Substance and Sexual Activity   Alcohol use: Yes    Alcohol/week: 5.0 standard drinks    Types: 5 Cans of beer per week   Drug use: No   Sexual activity: Yes    Partners: Male    Birth control/protection: I.U.D.    Comment: Mirena inserted 07/21/19  Other Topics Concern   Not on file  Social History Narrative   2 sisters   Brother    Lives with partner   Social Determinants of Health   Financial Resource Strain: Not on file  Food Insecurity: Not on file  Transportation Needs: Not on file  Physical Activity: Not on file  Stress: Not on file  Social Connections: Not on file  Intimate Partner Violence: Not on file    Outpatient Medications Prior to Visit  Medication Sig Dispense Refill   levonorgestrel (MIRENA) 20 MCG/24HR IUD 1 each by Intrauterine route once.     albuterol (VENTOLIN HFA) 108 (90 Base) MCG/ACT inhaler Inhale 1-2 puffs into the lungs every 6 (six) hours as needed for wheezing  or shortness of breath. 18 g 0   FLUoxetine (PROZAC) 20 MG capsule Take 1 capsule (20 mg total) by mouth daily. 90 capsule 3   methylphenidate (CONCERTA) 18 MG PO CR tablet Take 1 tablet (18 mg total) by mouth daily. Generic ok 30 tablet 0   metroNIDAZOLE (FLAGYL) 500 MG tablet Take 1 tablet (500 mg total) by mouth 2 (two) times daily. 14 tablet 0   montelukast (SINGULAIR) 10 MG tablet TAKE 1 TABLET BY MOUTH EVERYDAY AT BEDTIME 30 tablet 3   No facility-administered medications prior to visit.    No Known Allergies  ROS Review of Systems  Constitutional: Negative.   HENT: Negative.    Eyes: Negative.   Respiratory: Negative.    Cardiovascular: Negative.   Gastrointestinal: Negative.   Genitourinary: Negative.    Musculoskeletal: Negative.   Skin: Negative.   Neurological: Negative.   Psychiatric/Behavioral: Negative.    All other systems reviewed and are negative.    Objective:    Physical Exam Vitals and nursing note reviewed.  Constitutional:      General: She is not in acute distress.    Appearance: Normal appearance. She is normal weight. She is not ill-appearing, toxic-appearing or diaphoretic.  Cardiovascular:     Rate and Rhythm: Normal rate and regular rhythm.     Heart sounds: Normal heart sounds. No murmur heard.   No friction rub. No gallop.  Pulmonary:     Effort: Pulmonary effort is normal. No respiratory distress.     Breath sounds: Normal breath sounds. No stridor. No wheezing, rhonchi or rales.  Chest:     Chest wall: No tenderness.  Skin:    General: Skin is warm and dry.  Neurological:     General: No focal deficit present.     Mental Status: She is alert and oriented to person, place, and time. Mental status is at baseline.  Psychiatric:        Mood and Affect: Mood normal.        Behavior: Behavior normal.        Thought Content: Thought content normal.        Judgment: Judgment normal.    BP 118/81   Pulse 64   Temp 98.1 F (36.7 C) (Temporal)   Resp 17   Ht 5\' 3"  (1.6 m)   Wt 159 lb 9.6 oz (72.4 kg)   SpO2 98%   BMI 28.27 kg/m  Wt Readings from Last 3 Encounters:  11/10/21 159 lb 9.6 oz (72.4 kg)  08/29/21 157 lb (71.2 kg)  07/26/21 156 lb (70.8 kg)     Health Maintenance Due  Topic Date Due   Pneumococcal Vaccine 28-29 Years old (1 - PCV) Never done   COVID-19 Vaccine (4 - Booster for Pfizer series) 11/21/2020    There are no preventive care reminders to display for this patient.  Lab Results  Component Value Date   TSH 2.740 05/31/2020   Lab Results  Component Value Date   WBC 5.4 07/26/2021   HGB 14.6 07/26/2021   HCT 45.4 (H) 07/26/2021   MCV 93.4 07/26/2021   PLT 166 07/26/2021   Lab Results  Component Value Date   NA 137  07/26/2021   K 4.5 07/26/2021   CO2 27 07/26/2021   GLUCOSE 75 07/26/2021   BUN 23 07/26/2021   CREATININE 0.91 07/26/2021   BILITOT 0.5 07/26/2021   ALKPHOS 55 05/31/2020   AST 16 07/26/2021   ALT 24 07/26/2021   PROT  6.9 07/26/2021   ALBUMIN 4.2 05/31/2020   CALCIUM 9.2 07/26/2021   Lab Results  Component Value Date   CHOL 159 05/31/2020   Lab Results  Component Value Date   HDL 70 05/31/2020   Lab Results  Component Value Date   LDLCALC 78 05/31/2020   Lab Results  Component Value Date   TRIG 52 05/31/2020   Lab Results  Component Value Date   CHOLHDL 2.3 05/31/2020   Lab Results  Component Value Date   HGBA1C 5.0 05/31/2020      Assessment & Plan:   Problem List Items Addressed This Visit       Other   ADD (attention deficit disorder) - Primary   Relevant Medications   methylphenidate 27 MG PO TB24   Other Visit Diagnoses     Seasonal allergies       Relevant Medications   albuterol (VENTOLIN HFA) 108 (90 Base) MCG/ACT inhaler   montelukast (SINGULAIR) 10 MG tablet   Other Relevant Orders   Ambulatory referral to Allergy   Anxiety and depression       Relevant Medications   FLUoxetine (PROZAC) 20 MG capsule       Meds ordered this encounter  Medications   albuterol (VENTOLIN HFA) 108 (90 Base) MCG/ACT inhaler    Sig: Inhale 1-2 puffs into the lungs every 6 (six) hours as needed for wheezing or shortness of breath.    Dispense:  18 g    Refill:  3    Order Specific Question:   Supervising Provider    Answer:   Carlota Raspberry, JEFFREY R [[id number]]   montelukast (SINGULAIR) 10 MG tablet    Sig: Take 1 tablet (10 mg total) by mouth at bedtime.    Dispense:  90 tablet    Refill:  3    Order Specific Question:   Supervising Provider    Answer:   Carlota Raspberry, JEFFREY R [[id number]]   FLUoxetine (PROZAC) 20 MG capsule    Sig: Take 1 capsule (20 mg total) by mouth daily.    Dispense:  90 capsule    Refill:  3    Order Specific Question:   Supervising Provider     Answer:   Carlota Raspberry, JEFFREY R [[id number]]   methylphenidate 27 MG PO TB24    Sig: Take 1 tablet (27 mg total) by mouth daily.    Dispense:  30 tablet    Refill:  0    Order Specific Question:   Supervising Provider    Answer:   Carlota Raspberry, JEFFREY R [[id number]]    Follow-up: Return in about 6 months (around 6/0/6004) for concerta - follow up .   PLAN Refer to asthma and allergy for allergy testing Increase concerta to 27mg  CR po qd. Will monitor effect. Follow up in 6 mo if positive effect. Return for CPE and labs Patient encouraged to call clinic with any questions, comments, or concerns.  Maximiano Coss, NP

## 2021-11-10 NOTE — Patient Instructions (Addendum)
Ms. Fichter -  Doristine Devoid to see you  Increase concerta to 27mg  daily  Continue singulair, xyzal, and albuterol. I have referred to allergy/asthma. They'll call you in the coming weeks.  See you in 6 months, sooner if you need anything  Thank you  Rich    If you have lab work done today you will be contacted with your lab results within the next 2 weeks.  If you have not heard from [place] then please contact us. The fastest way to get your results is to register for My Chart.   IF you received an x-ray today, you will receive an invoice from [facility] [place] [facility]. Please contact [facility] at [phone] with questions or concerns regarding your invoice.   IF you received labwork today, you will receive an invoice from [facility]. Please contact [facility] at [phone] with questions or concerns regarding your invoice.   Our billing staff will not be able to assist you with questions regarding bills from these companies.  You will be contacted with the lab results as soon as they are available. The fastest way to get your results is to activate your My Chart account. Instructions are located on the last page of this paperwork. If you have not heard from [place] regarding the results in 2 weeks, please contact this office.

## 2021-11-21 ENCOUNTER — Encounter: Payer: Self-pay | Admitting: Registered Nurse

## 2021-11-21 ENCOUNTER — Other Ambulatory Visit: Payer: Self-pay

## 2021-11-21 ENCOUNTER — Telehealth (INDEPENDENT_AMBULATORY_CARE_PROVIDER_SITE_OTHER): Payer: 59 | Admitting: Family Medicine

## 2021-11-21 ENCOUNTER — Encounter: Payer: Self-pay | Admitting: Family Medicine

## 2021-11-21 VITALS — Temp 97.9°F | Ht 63.0 in | Wt 159.6 lb

## 2021-11-21 DIAGNOSIS — U071 COVID-19: Secondary | ICD-10-CM | POA: Diagnosis not present

## 2021-11-21 MED ORDER — BENZONATATE 100 MG PO CAPS: 100.0000 mg | ORAL_CAPSULE | Freq: Three times a day (TID) | ORAL | 0 refills | Status: DC | PRN

## 2021-11-21 MED ORDER — NIRMATRELVIR/RITONAVIR (PAXLOVID)TABLET: 3.0000 | ORAL_TABLET | Freq: Two times a day (BID) | ORAL | 0 refills | Status: AC

## 2021-11-21 NOTE — Progress Notes (Addendum)
MyChart Video Visit    Virtual Visit via Video Note   This visit type was conducted due to national recommendations for restrictions regarding the COVID-19 Pandemic (e.g. social distancing) in an effort to limit this patient's exposure and mitigate transmission in our community. This patient is at least at moderate risk for complications without adequate follow up. This format is felt to be most appropriate for this patient at this time. Physical exam was limited by quality of the video and audio technology used for the visit. CMA was able to get the patient set up on a video visit.  Patient location: Home. Patient and provider in visit Provider location: Office  I discussed the limitations of evaluation and management by telemedicine and the availability of in person appointments. The patient expressed understanding and agreed to proceed.  Visit Date: 11/21/2021  Today's healthcare provider: Wellington Hampshire., MD     Subjective:    Patient ID: Christy Alvarado, female    DOB: [birth year], 35 y.o.   MRN: [id number]  Chief Complaint  Patient presents with   Cough    Started Sunday night Have been taking Sudafed    Headache   Nasal Congestion    HPI Covid+yesterday morning Symptoms started Sunday eve 11/19/21.  This is 3rd episode of covid. Congested, ha,cough(mild).  Some myalgias.  Minimal fatigue.   Some DOE.  No f/c/n/v/d.  Would like to start on Paxlovid.   Past Medical History:  Diagnosis Date   ADD (attention deficit disorder)    Anemia    Anxiety    Asthma    grew out of it, childhood   Depression    Fatigue    Fibroid of cervix    Plantar wart of left foot 04/14/2014   Seasonal allergies    Syncope, vasovagal     Past Surgical History:  Procedure Laterality Date   HYSTEROSCOPY WITH D & C N/A 08/12/2018   Procedure: DILATATION AND CURETTAGE /HYSTEROSCOPY;  Surgeon: Salvadore Dom, MD;  Location: [facility];  Service: Gynecology;   Laterality: N/A;   NASAL SEPTUM SURGERY     PROLAPSED UTERINE FIBROID LIGATION N/A 08/12/2018   Procedure: MYOMECTOMY CERVICAL;  Surgeon: Salvadore Dom, MD;  Location[facility];  Service: Gynecology;  Laterality: N/A;   RECONSTRUCTION MANDIBLE / MAXILLA     underbite correction    Outpatient Medications Prior to Visit  Medication Sig Dispense Refill   albuterol (VENTOLIN HFA) 108 (90 Base) MCG/ACT inhaler Inhale 1-2 puffs into the lungs every 6 (six) hours as needed for wheezing or shortness of breath. 18 g 3   FLUoxetine (PROZAC) 20 MG capsule Take 1 capsule (20 mg total) by mouth daily. 90 capsule 3   levonorgestrel (MIRENA) 20 MCG/24HR IUD 1 each by Intrauterine route once.     methylphenidate 27 MG PO TB24 Take 1 tablet (27 mg total) by mouth daily. 30 tablet 0   montelukast (SINGULAIR) 10 MG tablet Take 1 tablet (10 mg total) by mouth at bedtime. 90 tablet 3   No facility-administered medications prior to visit.    No Known Allergies      Objective:     Physical Exam Vitals and nursing note reviewed.  Constitutional:      General: She is not in acute distress. nontoxic    Appearance: Normal appearance.  HENT:     Head: Normocephalic. Congested a lot Pulmonary:     Effort: No respiratory distress.  Skin:  General: Skin is dry.     Coloration: Skin is not pale.  Neurological:     Mental Status: She is alert and oriented to person, place, and time.  Psychiatric:        Mood and Affect: Mood normal.   Temp 97.9 F (36.6 C) (Oral)    Ht 5\' 3"  (1.6 m)    Wt 159 lb 9.6 oz (72.4 kg)    BMI 28.27 kg/m   Wt Readings from Last 3 Encounters:  11/21/21 159 lb 9.6 oz (72.4 kg)  11/10/21 159 lb 9.6 oz (72.4 kg)  08/29/21 157 lb (71.2 kg)       Assessment & Plan:   Problem List Items Addressed This Visit   None Visit Diagnoses     COVID-19    -  Primary   Relevant Medications   nirmatrelvir/ritonavir EUA (PAXLOVID) 20 x 150 MG & 10 x 100MG   TABS       Meds ordered this encounter  Medications   nirmatrelvir/ritonavir EUA (PAXLOVID) 20 x 150 MG & 10 x 100MG  TABS    Sig: Take 3 tablets by mouth 2 (two) times daily for 5 days. (Take nirmatrelvir 150 mg two tablets twice daily for 5 days and ritonavir 100 mg one tablet twice daily for 5 days) Patient GFR is >60    Dispense:  30 tablet    Refill:  0    Covid-pt requesting Paxlovid.  Use Albuterol if needed.  Discussed quarantine.  ER if worseing SOB, etc.  Called back for tessalon perles-sent I discussed the assessment and treatment plan with the patient. The patient was provided an opportunity to ask questions and all were answered. The patient agreed with the plan and demonstrated an understanding of the instructions.   The patient was advised to call back or seek an in-person evaluation if the symptoms worsen or if the condition fails to improve as anticipated.  I provided 12 minutes of face-to-face time during this encounter.   Wellington Hampshire., MD [place] [phone] (phone) [phone] (fax)  [place]

## 2021-11-21 NOTE — Patient Instructions (Addendum)
Have a Angela Nevin Christmas Paxlovid sent to the pharmacy Use the Albuterol if needed

## 2021-11-21 NOTE — Addendum Note (Signed)
Addended by: Wellington Hampshire on: 11/21/2021 03:11 PM   Modules accepted: Orders

## 2022-01-19 ENCOUNTER — Other Ambulatory Visit: Payer: Self-pay | Admitting: Registered Nurse

## 2022-01-19 DIAGNOSIS — F988 Other specified behavioral and emotional disorders with onset usually occurring in childhood and adolescence: Secondary | ICD-10-CM

## 2022-01-19 MED ORDER — METHYLPHENIDATE HCL ER 27 MG PO TB24: 27.0000 mg | ORAL_TABLET | Freq: Every day | ORAL | 0 refills | Status: DC

## 2022-01-19 NOTE — Telephone Encounter (Signed)
Patient is requesting a refill of the following medications: Requested Prescriptions   Pending Prescriptions Disp Refills   methylphenidate 27 MG PO TB24 30 tablet 0    Sig: Take 1 tablet (27 mg total) by mouth daily.    Date of patient request: 01/19/2022 Last office visit: 11/10/2021 Date of last refill: 11/10/2021 Last refill amount: 30 tanlets  Follow up time period per chart: 05/11/2022

## 2022-02-06 ENCOUNTER — Ambulatory Visit: Payer: 59 | Admitting: Allergy & Immunology

## 2022-02-15 ENCOUNTER — Ambulatory Visit: Payer: 59 | Admitting: Allergy & Immunology

## 2022-02-15 ENCOUNTER — Other Ambulatory Visit: Payer: Self-pay

## 2022-02-15 ENCOUNTER — Encounter: Payer: Self-pay | Admitting: Allergy & Immunology

## 2022-02-15 VITALS — BP 118/84 | HR 90 | Temp 97.8°F | Resp 18 | Ht 63.0 in | Wt 154.8 lb

## 2022-02-15 DIAGNOSIS — J4520 Mild intermittent asthma, uncomplicated: Secondary | ICD-10-CM | POA: Diagnosis not present

## 2022-02-15 DIAGNOSIS — J3089 Other allergic rhinitis: Secondary | ICD-10-CM | POA: Diagnosis not present

## 2022-02-15 DIAGNOSIS — K9049 Malabsorption due to intolerance, not elsewhere classified: Secondary | ICD-10-CM

## 2022-02-15 DIAGNOSIS — J302 Other seasonal allergic rhinitis: Secondary | ICD-10-CM | POA: Insufficient documentation

## 2022-02-15 MED ORDER — IPRATROPIUM BROMIDE 0.03 % NA SOLN: NASAL | 5 refills | Status: DC

## 2022-02-15 MED ORDER — BUDESONIDE-FORMOTEROL FUMARATE 160-4.5 MCG/ACT IN AERO: 2.0000 | INHALATION_SPRAY | Freq: Every day | RESPIRATORY_TRACT | 5 refills | Status: DC

## 2022-02-15 NOTE — Patient Instructions (Addendum)
1. Mild intermittent asthma, uncomplicated ?- Lung testing looked so-so and you did not improve with the albuterol treatment. ?- Certainly there is a component of exercise induced symptoms, so I would like to start you on a combined inhaled steroid with a long acting albuterol. ?- We can just do one puff in the morning to see if this is enough to make exercise more tolerable for you. ?- Spacer sample and demonstration provided. ?- Daily controller medication(s): Symbicort 160/4.58mg two puffs once daily ?- Prior to physical activity: albuterol 2 puffs 10-15 minutes before physical activity. ?- Rescue medications: albuterol 4 puffs every 4-6 hours as needed ?- Asthma control goals:  ?* Full participation in all desired activities (may need albuterol before activity) ?* Albuterol use two time or less a week on average (not counting use with activity) ?* Cough interfering with sleep two time or less a month ?* Oral steroids no more than once a year ?* No hospitalizations ? ?2. Chronic rhinitis ?- Testing today showed: grasses, weeds, dust mites, and cat. ?- Copy of test results provided.  ?- Avoidance measures provided. ?- Continue with: Xyzal (levocetirizine) '5mg'$  tablet once daily and Singulair (montelukast) '10mg'$  daily ?- Start taking: Atrovent (ipratropium) 0.03% one spray per nostril 2-3 times daily as needed (CAN BE OVER DRYING) ?- You can use an extra dose of the antihistamine, if needed, for breakthrough symptoms.  ?- Consider nasal saline rinses 1-2 times daily to remove allergens from the nasal cavities as well as help with mucous clearance (this is especially helpful to do before the nasal sprays are given) ?- Consider allergy shots as a means of long-term control. ?- Allergy shots "re-train" and "reset" the immune system to ignore environmental allergens and decrease the resulting immune response to those allergens (sneezing, itchy watery eyes, runny nose, nasal congestion, etc).    ?- Allergy shots improve  symptoms in 75-85% of patients.  ?- We can discuss more at the next appointment if the medications are not working for you. ? ?3. Food intolerance ?- Testing was negative to hops. ?- There is no need for an EpiPen. ?- You could try using an extra Xyzal before you drink IPAs to see if this helps. ? ?4. Return in about 3 months (around 05/18/2022).  ? ? ?Please inform [place] any Emergency Department visits, hospitalizations, or changes in symptoms. Call [place]efore going to the ED for breathing or allergy symptoms since we might be able to fit you in for a sick visit. Feel free to contact u[place]anytime with any questions, problems, or concerns. ? ?It was a pleasure to meet you today! ? ?Websites that have reliable patient information: ?1. American Academy of Asthma, Allergy, and Immunology: www.aaaai.org ?2. Food Allergy Research and Education (FARE): foodallergy.org ?3. Mothers of Asthmatics: http://www.asthmacommunitynetwork.org ?4. [employer] Corporationof Allergy, Asthma, and Immunology: wMonthlyElectricBill.co.uk? ? ?COVID-19 Vaccine Information can be found at: [email or web address] questions related to vaccine distribution or appointments, please email [email or web address] or call [phone]  ? ?We realize that you might be concerned about having an allergic reaction to the COVID19 vaccines. To help with that concern, WE ARE OFFERING THE COVID19 VACCINES IN OUR OFFICE! Ask the front desk for dates!  ? ? ? ??Like? [facility] Facebook and Instagram for our latest updates!  ?  ? ? ?A healthy democracy works best when ANew [employer] Life Insuranceparticipate! Make sure you are registered to vote! If you have moved or changed any of your contact information, you will need  to get this updated before voting! ? ?In some cases, you MAY be able to register to vote online: [email or web address] ? ? ? ? ? Airborne Adult Perc - 02/15/22 1050   ? ? Time Antigen Placed 1027   ?  Allergen Manufacturer Lavella Hammock   ? Location Back   ? Number of Test 59   ? 1. Control-Buffer 50% Glycerol Negative   ? 2. Control-Histamine 1 mg/ml 2+   ? 3. Albumin saline Negative   ? 4. [place] Negative   ? 5. [place] Negative   ? 6. Johnson Negative   ? 7. Mason Blue Negative   ? 8. Meadow Fescue Negative   ? 9. Perennial Rye Negative   ? 10. Sweet Vernal 4+   ? 11. Timothy Negative   ? 12. Cocklebur 2+   ? 13. Burweed Marshelder Negative   ? 14. Ragweed, short Negative   ? 15. Ragweed, Giant Negative   ? 16. Plantain,  English Negative   ? 17. Lamb's Quarters Negative   ? 18. Sheep Sorrell Negative   ? 19. Rough Pigweed Negative   ? 20. Marsh Elder, Rough Negative   ? 21. Mugwort, Common Negative   ? 22. Ash mix Negative   ? 23. Wendee Copp mix Negative   ? 24. Beech American Negative   ? 25. Box, Elder Negative   ? 26. Cedar, red Negative   ? 27. Cottonwood, [place] Negative   ? 28. Elm mix Negative   ? 29. Hickory Negative   ? 30. Maple mix Negative   ? 31. Oak, [place] mix Negative   ? 32. Pecan Pollen Negative   ? 33. Pine mix Negative   ? 34. Sycamore Eastern Negative   ? 35. Walnut, Black Pollen Negative   ? 36. Alternaria alternata Negative   ? 59. Cladosporium Herbarum Negative   ? 38. Aspergillus mix Negative   ? 39. Penicillium mix Negative   ? 40. Bipolaris sorokiniana (Helminthosporium) Negative   ? 41. Drechslera spicifera (Curvularia) Negative   ? 42. Mucor plumbeus Negative   ? 43. Fusarium moniliforme Negative   ? 44. Aureobasidium pullulans (pullulara) Negative   ? 45. Rhizopus oryzae Negative   ? 46. Botrytis cinera Negative   ? 47. Epicoccum nigrum Negative   ? 48. Phoma betae Negative   ? 49. Candida Albicans Negative   ? 50. Trichophyton mentagrophytes Negative   ? 51. Mite, D Farinae  5,000 AU/ml 2+   ? 52. Mite, D Pteronyssinus  5,000 AU/ml Negative   ? 53. Cat Hair 10,000 BAU/ml Negative   ? 54.  Dog Epithelia Negative   ? 55. Mixed Feathers Negative   ? 56. Horse Epithelia Negative   ? 57.  Cockroach, [place] Negative   ? 58. Mouse Negative   ? 59. Tobacco Leaf Negative   ? Other --   HOPS: NEGATIVE  ? Other Omitted   ? ?  ?  ? ?  ? ? Intradermal - 02/15/22 1055   ? ? Number of Test 15   ? Control Negative   ? [place] Negative   ? Johnson 1+   ? Ragweed mix Negative   ? Weed mix 1+   ? Tree mix Negative   ? Mold 1 Negative   ? Mold 2 Negative   ? Mold 3 Negative   ? Mold 4 Negative   ? Cat 4+   ? Dog Negative   ? Cockroach Negative   ? ?  ?  ? ?  ? ? ?  Reducing Pollen Exposure ? ?The American Academy of Allergy, Asthma and Immunology suggests the following steps to reduce your exposure to pollen during allergy seasons. ?   ?Do not hang sheets or clothing out to dry; pollen may collect on these items. ?Do not mow lawns or spend time around freshly cut grass; mowing stirs up pollen. ?Keep windows closed at night.  Keep car windows closed while driving. ?Minimize morning activities outdoors, a time when pollen counts are usually at their highest. ?Stay indoors as much as possible when pollen counts or humidity is high and on windy days when pollen tends to remain in the air longer. ?Use air conditioning when possible.  Many air conditioners have filters that trap the pollen spores. ?Use a HEPA room air filter to remove pollen form the indoor air you breathe. ? ?Control of Dust Mite Allergen ? ? ? ?Dust mites play a major role in allergic asthma and rhinitis.  They occur in environments with high humidity wherever human skin is found.  Dust mites absorb humidity from the atmosphere (ie, they do not drink) and feed on organic matter (including shed human and animal skin).  Dust mites are a microscopic type of insect that you cannot see with the naked eye.  High levels of dust mites have been detected from mattresses, pillows, carpets, upholstered furniture, bed covers, clothes, soft toys and any woven material.  The principal allergen of the dust mite is found in its feces.  A gram of dust may contain 1,000  mites and 250,000 fecal particles.  Mite antigen is easily measured in the air during house cleaning activities.  Dust mites do not bite and do not cause harm to humans, other than by triggering allergies/asth

## 2022-02-15 NOTE — Progress Notes (Signed)
? ?NEW PATIENT ? ?Date of Service/Encounter:  02/15/22 ? ?Consult requested by: Maximiano Coss, NP ? ? ?Assessment:  ? ?Mild intermittent asthma, uncomplicated ? ?Seasonal and perennial allergic rhinitis ? ?Food intolerance ? ?Plan/Recommendations:  ? ?1. Mild intermittent asthma, uncomplicated ?- Lung testing looked so-so and you did not improve with the albuterol treatment. ?- Certainly there is a component of exercise induced symptoms, so I would like to start you on a combined inhaled steroid with a long acting albuterol. ?- We can just do one puff in the morning to see if this is enough to make exercise more tolerable for you. ?- Spacer sample and demonstration provided. ?- Daily controller medication(s): Symbicort 160/4.38mg two puffs once daily ?- Prior to physical activity: albuterol 2 puffs 10-15 minutes before physical activity. ?- Rescue medications: albuterol 4 puffs every 4-6 hours as needed ?- Asthma control goals:  ?* Full participation in all desired activities (may need albuterol before activity) ?* Albuterol use two time or less a week on average (not counting use with activity) ?* Cough interfering with sleep two time or less a month ?* Oral steroids no more than once a year ?* No hospitalizations ? ?2. Chronic rhinitis ?- Testing today showed: grasses, weeds, dust mites, and cat. ?- Copy of test results provided.  ?- Avoidance measures provided. ?- Continue with: Xyzal (levocetirizine) '5mg'$  tablet once daily and Singulair (montelukast) '10mg'$  daily ?- Start taking: Atrovent (ipratropium) 0.03% one spray per nostril 2-3 times daily as needed (CAN BE OVER DRYING) ?- You can use an extra dose of the antihistamine, if needed, for breakthrough symptoms.  ?- Consider nasal saline rinses 1-2 times daily to remove allergens from the nasal cavities as well as help with mucous clearance (this is especially helpful to do before the nasal sprays are given) ?- Consider allergy shots as a means of long-term  control. ?- Allergy shots "re-train" and "reset" the immune system to ignore environmental allergens and decrease the resulting immune response to those allergens (sneezing, itchy watery eyes, runny nose, nasal congestion, etc).    ?- Allergy shots improve symptoms in 75-85% of patients.  ?- We can discuss more at the next appointment if the medications are not working for you. ? ?3. Food intolerance ?- Testing was negative to hops. ?- There is no need for an EpiPen. ?- You could try using an extra Xyzal before you drink IPAs to see if this helps. ? ?4. Return in about 3 months (around 05/18/2022).  ? ? ? ?This note in its entirety was forwarded to the Provider who requested this consultation. ? ?Subjective:  ? ?DEARLEEN AOUNis a 36y.o. female presenting today for evaluation of  ?Chief Complaint  ?Patient presents with  ? Allergy Testing  ?  Environmental - Grasses; pollen; molds; cats; dogs; birds ?Foods: Fine  ? Asthma  ?  Childhood -grew out of;  ?Exercised induced  ? ? ?DOdette Hornshas a history of the following: ?Patient Active Problem List  ? Diagnosis Date Noted  ? Mild intermittent asthma, uncomplicated 038/25/0539 ? Seasonal and perennial allergic rhinitis 02/15/2022  ? Food intolerance 02/15/2022  ? Localized swelling of right upper extremity 02/23/2020  ? Iron deficiency anemia 09/01/2012  ? 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 Depression   ? ADD (attention deficit disorder)   ? ? ?History obtained from: chart review and patient. ? ?DOdette Hornswas referred by MMaximiano Coss NP.    ? ?DRasheenis a 36y.o.  female presenting for an evaluation of asthma and allergies . ?  ?Asthma/Respiratory Symptom History: She has a history of exercise-induced asthma.  She denies any wheezing.  She does report some coughing episodes that she feels are secondary to allergies.  She rarely uses her albuterol, although she makes a comment that she needs to use it more often than she does.  It seems to be that physical  activity is the worst trigger for her symptoms.  However, she does report that she has nighttime coughing which improved drastically with initiation of montelukast.  This was started around 3 years ago.  She has not required any prednisone in the emergency room for her symptoms.  She has never been on a daily controller medication. ? ?Allergic Rhinitis Symptom History: Her allergies are bad throughout the year.  They are definitely worse in the spring.  She does report that she is diagnosed with bronchitis around 1 time per year, typically in May or winter.  These episodes are treated with antibiotics.  She also has a history of sinus infections around 1-2 times per year with antibiotics given once per year. Symptoms are definitely worse around animals.  She has no pets of her own, but she avoids touching cats and dogs when she is visiting friends and family. ? ?Food Allergy Symptom History: She tolerates all the major food allergens without adverse event.  However, she does report that when she drinks IPAs, she becomes itchy.  She tolerates stouts ans pilsners without any problems.  She last had this happen last night when she was at a trivia at night at [employer].  She was itching quite a, but her team 1 nonetheless. ? ?Otherwise, there is no history of other atopic diseases, including drug allergies, stinging insect allergies, eczema, urticaria, or contact dermatitis. There is no significant infectious history. Vaccinations are up to date.  ? ? ?Past Medical History: ?Patient Active Problem List  ? Diagnosis Date Noted  ? Mild intermittent asthma, uncomplicated 50/56/9794  ? Seasonal and perennial allergic rhinitis 02/15/2022  ? Food intolerance 02/15/2022  ? Localized swelling of right upper extremity 02/23/2020  ? Iron deficiency anemia 09/01/2012  ? 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 Depression   ? ADD (attention deficit disorder)   ? ? ?Medication List:  ?Allergies as of 02/15/2022    ?No Known Allergies ?  ? ?  ?Medication List  ?  ? ?  ? Accurate as of February 15, 2022 12:49 PM. If you have any questions, ask your nurse or doctor.  ?  ?  ? ?  ? ?STOP taking these medications   ? ?benzonatate 100 MG capsule ?Commonly known as: Best boy ?Stopped by: Valentina Shaggy, MD ?  ? ?  ? ?TAKE these medications   ? ?albuterol 108 (90 Base) MCG/ACT inhaler ?Commonly known as: VENTOLIN HFA ?Inhale 1-2 puffs into the lungs every 6 (six) hours as needed for wheezing or shortness of breath. ?  ?budesonide-formoterol 160-4.5 MCG/ACT inhaler ?Commonly known as: Symbicort ?Inhale 2 puffs into the lungs daily. ?Started by: Valentina Shaggy, MD ?  ?FLUoxetine 20 MG capsule ?Commonly known as: PROZAC ?Take 1 capsule (20 mg total) by mouth daily. ?  ?ipratropium 0.03 % nasal spray ?Commonly known as: ATROVENT ?Place 1 spray per nostril 2-3 times daily as needed ?Started by: Valentina Shaggy, MD ?  ?levocetirizine 5 MG tablet ?Commonly known as: XYZAL ?Take 5 mg by mouth in the morning. ?  ?levonorgestrel  20 MCG/24HR IUD ?Commonly known as: MIRENA ?1 each by Intrauterine route once. ?  ?methylphenidate 27 MG Tb24 ?Commonly known as: CONCERTA ?Take 1 tablet (27 mg total) by mouth daily. ?  ?montelukast 10 MG tablet ?Commonly known as: SINGULAIR ?Take 1 tablet (10 mg total) by mouth at bedtime. ?  ? ?  ? ? ?Birth History: non-contributory ? ?Developmental History: non-contributory ? ?Past Surgical History: ?Past Surgical History:  ?Procedure Laterality Date  ? HYSTEROSCOPY WITH D & C N/A 08/12/2018  ? Procedure: DILATATION AND CURETTAGE /HYSTEROSCOPY;  Surgeon: Salvadore Dom, MD;  Location: [facility];  Service: Gynecology;  Laterality: N/A;  ? NASAL SEPTUM SURGERY    ? PROLAPSED UTERINE FIBROID LIGATION N/A 08/12/2018  ? Procedure: MYOMECTOMY CERVICAL;  Surgeon: Salvadore Dom, MD;  Location: [facility];  Service: Gynecology;  Laterality: N/A;  ?  RECONSTRUCTION MANDIBLE / MAXILLA    ? underbite correction  ? ? ? ?Family History: ?Family History  ?Problem Relation Age of Onset  ? Cancer Father   ?     prostate  ? Stroke Father   ? Heart disease Maternal Grandmot

## 2022-02-22 ENCOUNTER — Other Ambulatory Visit: Payer: Self-pay | Admitting: Registered Nurse

## 2022-02-22 DIAGNOSIS — F988 Other specified behavioral and emotional disorders with onset usually occurring in childhood and adolescence: Secondary | ICD-10-CM

## 2022-02-22 NOTE — Telephone Encounter (Signed)
Patient is requesting a refill of the following medications: ?Requested Prescriptions  ? ?Pending Prescriptions Disp Refills  ? methylphenidate 27 MG PO TB24 30 tablet 0  ?  Sig: Take 1 tablet (27 mg total) by mouth daily.  ? ? ?Date of patient request: 02/22/22 ?Last office visit: 11/10/21 ?Date of last refill: 01/19/22 ?Last refill amount: 30 ? ?

## 2022-02-23 MED ORDER — METHYLPHENIDATE HCL ER 27 MG PO TB24: 27.0000 mg | ORAL_TABLET | Freq: Every day | ORAL | 0 refills | Status: DC

## 2022-04-04 ENCOUNTER — Other Ambulatory Visit: Payer: Self-pay | Admitting: Registered Nurse

## 2022-04-04 DIAGNOSIS — F988 Other specified behavioral and emotional disorders with onset usually occurring in childhood and adolescence: Secondary | ICD-10-CM

## 2022-04-04 MED ORDER — METHYLPHENIDATE HCL ER 27 MG PO TB24: 27.0000 mg | ORAL_TABLET | Freq: Every day | ORAL | 0 refills | Status: DC

## 2022-04-04 NOTE — Telephone Encounter (Signed)
Patient is requesting a refill of the following medications: ?Requested Prescriptions  ? ?Pending Prescriptions Disp Refills  ? methylphenidate 27 MG PO TB24 30 tablet 0  ?  Sig: Take 1 tablet (27 mg total) by mouth daily.  ? ? ?Date of patient request: 04/04/22 ?Last office visit: 02/23/22 ?Date of last refill: 11/10/21 ?Last refill amount: 30 ? ?

## 2022-04-04 NOTE — Telephone Encounter (Signed)
Pt called in asking for a refill on the methylphenidate, pt uses [facility] [address]  ? ?Please advise  ?

## 2022-05-08 ENCOUNTER — Ambulatory Visit (INDEPENDENT_AMBULATORY_CARE_PROVIDER_SITE_OTHER): Payer: 59 | Admitting: Registered Nurse

## 2022-05-08 ENCOUNTER — Encounter: Payer: Self-pay | Admitting: Registered Nurse

## 2022-05-08 VITALS — BP 120/74 | HR 80 | Temp 98.2°F | Resp 16 | Ht 63.0 in | Wt 156.8 lb

## 2022-05-08 DIAGNOSIS — F32A Depression, unspecified: Secondary | ICD-10-CM

## 2022-05-08 DIAGNOSIS — F988 Other specified behavioral and emotional disorders with onset usually occurring in childhood and adolescence: Secondary | ICD-10-CM

## 2022-05-08 DIAGNOSIS — F419 Anxiety disorder, unspecified: Secondary | ICD-10-CM

## 2022-05-08 MED ORDER — BUPROPION HCL ER (SR) 150 MG PO TB12: 150.0000 mg | ORAL_TABLET | Freq: Two times a day (BID) | ORAL | 1 refills | Status: DC

## 2022-05-08 MED ORDER — METHYLPHENIDATE HCL ER 27 MG PO TB24
27.0000 mg | ORAL_TABLET | Freq: Every day | ORAL | 0 refills | Status: DC
Start: 2022-05-08 — End: 2022-06-04

## 2022-05-08 NOTE — Assessment & Plan Note (Addendum)
Concerta still working - trouble at work sometimes, but she admits she does not love her job  Refills given x 6 mo

## 2022-05-08 NOTE — Patient Instructions (Addendum)
Ms. Westrich -   Doristine Devoid to see you!  Can start on burpropion '150mg'$  daily.  Can recheck in 6 weeks.  Can stop fluoxetine or continue on this. Your preference.  If doing well on bupropion after a few weeks, can start twice daily dosing. If doing well after that, we can change to '300mg'$  XL once daily.  Call with concerns  Thanks,  Denice Paradise

## 2022-05-08 NOTE — Progress Notes (Signed)
Established Patient Office Visit  Subjective:  Patient ID: Christy Alvarado, female    DOB: 09-Aug-1986  Age: 36 y.o. MRN: [id number]  CC:  Chief Complaint  Patient presents with   concerta    6 month follow up on medication.  Pt is doing a lot better.   Depression    Wants to change to a new medication instead of prozac    HPI Christy Alvarado presents for 6 mo follow up   Anxiety and Depression Feels that her counselor is at a plateau, would like referral to someone new Having more ups and downs - not as stable on prozac at this time.  Interested in other options. Has a friend who's done well with burpopion.  ADHD Stable on concerta.  No AE Would like to continue Pdmp consulted, no concerns.   Outpatient Medications Prior to Visit  Medication Sig Dispense Refill   albuterol (VENTOLIN HFA) 108 (90 Base) MCG/ACT inhaler Inhale 1-2 puffs into the lungs every 6 (six) hours as needed for wheezing or shortness of breath. 18 g 3   ipratropium (ATROVENT) 0.03 % nasal spray Place 1 spray per nostril 2-3 times daily as needed 30 mL 5   levocetirizine (XYZAL) 5 MG tablet Take 5 mg by mouth in the morning.     levonorgestrel (MIRENA) 20 MCG/24HR IUD 1 each by Intrauterine route once.     montelukast (SINGULAIR) 10 MG tablet Take 1 tablet (10 mg total) by mouth at bedtime. 90 tablet 3   FLUoxetine (PROZAC) 20 MG capsule Take 1 capsule (20 mg total) by mouth daily. 90 capsule 3   methylphenidate 27 MG PO TB24 Take 1 tablet (27 mg total) by mouth daily. 30 tablet 0   budesonide-formoterol (SYMBICORT) 160-4.5 MCG/ACT inhaler Inhale 2 puffs into the lungs daily. 1 each 5   No facility-administered medications prior to visit.    Review of Systems  Constitutional: Negative.   HENT: Negative.    Eyes: Negative.   Respiratory: Negative.    Cardiovascular: Negative.   Gastrointestinal: Negative.   Genitourinary: Negative.   Musculoskeletal: Negative.   Skin: Negative.   Neurological:  Negative.   Psychiatric/Behavioral: Negative.    All other systems reviewed and are negative.    Objective:     BP 120/74   Pulse 80   Temp 98.2 F (36.8 C) (Temporal)   Resp 16   Ht '5\' 3"'$  (1.6 m)   Wt 156 lb 12.8 oz (71.1 kg)   SpO2 99%   BMI 27.78 kg/m   Wt Readings from Last 3 Encounters:  05/08/22 156 lb 12.8 oz (71.1 kg)  02/15/22 154 lb 12.8 oz (70.2 kg)  11/21/21 159 lb 9.6 oz (72.4 kg)   Physical Exam Vitals and nursing note reviewed.  Constitutional:      General: She is not in acute distress.    Appearance: Normal appearance. She is normal weight. She is not ill-appearing, toxic-appearing or diaphoretic.  Cardiovascular:     Rate and Rhythm: Normal rate and regular rhythm.     Heart sounds: Normal heart sounds. No murmur heard.   No friction rub. No gallop.  Pulmonary:     Effort: Pulmonary effort is normal. No respiratory distress.     Breath sounds: Normal breath sounds. No stridor. No wheezing, rhonchi or rales.  Chest:     Chest wall: No tenderness.  Skin:    General: Skin is warm and dry.  Neurological:     General: No  focal deficit present.     Mental Status: She is alert and oriented to person, place, and time. Mental status is at baseline.  Psychiatric:        Mood and Affect: Mood normal.        Behavior: Behavior normal.        Thought Content: Thought content normal.        Judgment: Judgment normal.    No results found for any visits on 05/08/22.    The ASCVD Risk score (Arnett DK, et al., 2019) failed to calculate for the following reasons:   The 2019 ASCVD risk score is only valid for ages 24 to 22    Assessment & Plan:   Problem List Items Addressed This Visit       Other   Anxiety and depression - Primary    Start wellbutrin. Monitor effect. Med check 6 weeks via MyChart.       Relevant Medications   buPROPion (WELLBUTRIN SR) 150 MG 12 hr tablet   Other Relevant Orders   Ambulatory referral to Psychology   ADD  (attention deficit disorder)    Concerta still working - trouble at work sometimes, but she admits she does not love her job  Refills given x 6 mo       Relevant Medications   methylphenidate 27 MG PO TB24   buPROPion (WELLBUTRIN SR) 150 MG 12 hr tablet    Meds ordered this encounter  Medications   methylphenidate 27 MG PO TB24    Sig: Take 1 tablet (27 mg total) by mouth daily.    Dispense:  30 tablet    Refill:  0    Order Specific Question:   Supervising Provider    Answer:   Carlota Raspberry, JEFFREY R [[id number]]   buPROPion (WELLBUTRIN SR) 150 MG 12 hr tablet    Sig: Take 1 tablet (150 mg total) by mouth 2 (two) times daily.    Dispense:  180 tablet    Refill:  1    Order Specific Question:   Supervising Provider    Answer:   Carlota Raspberry, JEFFREY R [[id number]]    Return in about 6 months (around 11/07/2022) for Chronic Conditions.    Christy Coss, NP

## 2022-05-08 NOTE — Assessment & Plan Note (Signed)
Start wellbutrin. Monitor effect. Med check 6 weeks via MyChart.

## 2022-05-11 ENCOUNTER — Ambulatory Visit: Payer: Self-pay | Admitting: Registered Nurse

## 2022-05-24 ENCOUNTER — Ambulatory Visit: Payer: 59 | Admitting: Allergy & Immunology

## 2022-05-24 ENCOUNTER — Encounter: Payer: Self-pay | Admitting: Allergy & Immunology

## 2022-05-24 VITALS — BP 118/70 | HR 78 | Temp 98.5°F | Resp 16 | Wt 158.4 lb

## 2022-05-24 DIAGNOSIS — J4520 Mild intermittent asthma, uncomplicated: Secondary | ICD-10-CM

## 2022-05-24 DIAGNOSIS — J3089 Other allergic rhinitis: Secondary | ICD-10-CM | POA: Diagnosis not present

## 2022-05-24 DIAGNOSIS — J302 Other seasonal allergic rhinitis: Secondary | ICD-10-CM

## 2022-05-24 MED ORDER — IPRATROPIUM BROMIDE 0.03 % NA SOLN
NASAL | 5 refills | Status: DC
Start: 2022-05-24 — End: 2023-01-01

## 2022-05-24 MED ORDER — BUDESONIDE-FORMOTEROL FUMARATE 160-4.5 MCG/ACT IN AERO: INHALATION_SPRAY | RESPIRATORY_TRACT | 5 refills | Status: DC

## 2022-05-24 NOTE — Patient Instructions (Addendum)
1. Mild intermittent asthma, uncomplicated - Lung testing looked excellent today.  - Continue the good work!  - Spacer sample and demonstration provided. - Daily controller medication(s): Symbicort 160/4.8mg two puffs once daily - Prior to physical activity: albuterol 2 puffs 10-15 minutes before physical activity. - Rescue medications: albuterol 4 puffs every 4-6 hours as needed - Asthma control goals:  * Full participation in all desired activities (may need albuterol before activity) * Albuterol use two time or less a week on average (not counting use with activity) * Cough interfering with sleep two time or less a month * Oral steroids no more than once a year * No hospitalizations  2. Chronic rhinitis (grasses, weeds, dust mites, and cat) - Continue with: Xyzal (levocetirizine) '5mg'$  tablet once daily and Singulair (montelukast) '10mg'$  daily - Continue with: Atrovent (ipratropium) 0.03% one spray per nostril 2-3 times daily as needed (CAN BE OVER DRYING) - You can use an extra dose of the antihistamine, if needed, for breakthrough symptoms.  - Consider nasal saline rinses 1-2 times daily to remove allergens from the nasal cavities as well as help with mucous clearance (this is especially helpful to do before the nasal sprays are given) - We can hold off on allergy shots as a means of long-term control.  3. Return in about 6 months (around 11/23/2022).    Please inform [place] any Emergency Department visits, hospitalizations, or changes in symptoms. Call [place]efore going to the ED for breathing or allergy symptoms since we might be able to fit you in for a sick visit. Feel free to contact u[place]anytime with any questions, problems, or concerns.  It was a pleasure to see you again today!  Websites that have reliable patient information: 1. American Academy of Asthma, Allergy, and Immunology: www.aaaai.org 2. Food Allergy Research and Education (FARE): foodallergy.org 3. Mothers of  Asthmatics: http://www.asthmacommunitynetwork.org 4. American College of Allergy, Asthma, and Immunology: www.acaai.org   COVID-19 Vaccine Information can be found at: [email or web address] questions related to vaccine distribution or appointments, please email [email or web address] or call [phone]   We realize that you might be concerned about having an allergic reaction to the COVID19 vaccines. To help with that concern, WE ARE OFFERING THE COVID19 VACCINES IN OUR OFFICE! Ask the front desk for dates!     "Like" u[place] Facebook and Instagram for our latest updates!      A healthy democracy works best when ANew [place] Life Insuranceparticipate! Make sure you are registered to vote! If you have moved or changed any of your contact information, you will need to get this updated before voting!  In some cases, you MAY be able to register to vote online: [email or web address]

## 2022-06-04 ENCOUNTER — Other Ambulatory Visit: Payer: Self-pay | Admitting: Registered Nurse

## 2022-06-04 DIAGNOSIS — F988 Other specified behavioral and emotional disorders with onset usually occurring in childhood and adolescence: Secondary | ICD-10-CM

## 2022-06-04 NOTE — Telephone Encounter (Signed)
Patient is requesting a refill of the following medications: Requested Prescriptions   Pending Prescriptions Disp Refills   methylphenidate 27 MG PO TB24 30 tablet 0    Sig: Take 1 tablet (27 mg total) by mouth daily.    Date of patient request: 06/04/22 Last office visit: 05/08/22 Date of last refill: 05/08/22 Last refill amount: 30

## 2022-06-05 MED ORDER — METHYLPHENIDATE HCL ER 27 MG PO TB24: 27.0000 mg | ORAL_TABLET | Freq: Every day | ORAL | 0 refills | Status: DC

## 2022-06-05 NOTE — Telephone Encounter (Signed)
Last refill filled on 04/24/22 for 30 day supply per PDMP OV on 05/08/22  Upcoming OV on 11/07/22  Kathrin Ruddy, NP

## 2022-08-17 ENCOUNTER — Telehealth: Payer: Self-pay | Admitting: Registered Nurse

## 2022-08-17 ENCOUNTER — Other Ambulatory Visit: Payer: Self-pay | Admitting: Family

## 2022-08-17 DIAGNOSIS — F988 Other specified behavioral and emotional disorders with onset usually occurring in childhood and adolescence: Secondary | ICD-10-CM

## 2022-08-17 MED ORDER — METHYLPHENIDATE HCL ER 27 MG PO TB24: 27.0000 mg | ORAL_TABLET | Freq: Every day | ORAL | 0 refills | Status: DC

## 2022-08-17 NOTE — Telephone Encounter (Signed)
Encourage patient to contact the pharmacy for refills or they can request refills through [facility]  (Please schedule appointment if patient has not been seen in over a year)  Last appt with Rich was 05/24/22  WHAT PHARMACY WOULD THEY LIKE THIS SENT TO: [id number] [address] [place]SE: methylphenidate 27 mg  NOTES/COMMENTS FROM PATIENT: pt states that she currently looking for a new pcp      Front office please notify patient: It takes 48-72 hours to process rx refill requests Ask patient to call pharmacy to ensure rx is ready before heading there.

## 2022-11-07 ENCOUNTER — Ambulatory Visit: Payer: 59 | Admitting: Registered Nurse

## 2022-11-29 ENCOUNTER — Ambulatory Visit: Payer: Self-pay | Admitting: Allergy & Immunology

## 2022-12-11 ENCOUNTER — Ambulatory Visit: Payer: Self-pay | Admitting: Allergy & Immunology

## 2022-12-28 ENCOUNTER — Ambulatory Visit: Payer: Self-pay | Admitting: Obstetrics and Gynecology

## 2023-01-01 ENCOUNTER — Encounter: Payer: Self-pay | Admitting: Internal Medicine

## 2023-01-01 ENCOUNTER — Ambulatory Visit: Payer: 59 | Admitting: Internal Medicine

## 2023-01-01 ENCOUNTER — Other Ambulatory Visit: Payer: Self-pay

## 2023-01-01 VITALS — BP 120/78 | HR 74 | Temp 98.3°F | Resp 16 | Ht 63.0 in | Wt 172.3 lb

## 2023-01-01 DIAGNOSIS — R062 Wheezing: Secondary | ICD-10-CM

## 2023-01-01 DIAGNOSIS — R053 Chronic cough: Secondary | ICD-10-CM

## 2023-01-01 DIAGNOSIS — R0602 Shortness of breath: Secondary | ICD-10-CM | POA: Diagnosis not present

## 2023-01-01 DIAGNOSIS — J4520 Mild intermittent asthma, uncomplicated: Secondary | ICD-10-CM

## 2023-01-01 DIAGNOSIS — J302 Other seasonal allergic rhinitis: Secondary | ICD-10-CM

## 2023-01-01 MED ORDER — LEVOCETIRIZINE DIHYDROCHLORIDE 5 MG PO TABS: 5.0000 mg | ORAL_TABLET | Freq: Every morning | ORAL | 5 refills | Status: AC

## 2023-01-01 MED ORDER — IPRATROPIUM BROMIDE 0.03 % NA SOLN: NASAL | 5 refills | Status: DC

## 2023-01-01 MED ORDER — BUDESONIDE-FORMOTEROL FUMARATE 80-4.5 MCG/ACT IN AERO: INHALATION_SPRAY | RESPIRATORY_TRACT | 5 refills | Status: DC

## 2023-01-01 MED ORDER — MONTELUKAST SODIUM 10 MG PO TABS: 10.0000 mg | ORAL_TABLET | Freq: Every day | ORAL | 1 refills | Status: DC

## 2023-01-01 NOTE — Patient Instructions (Addendum)
Mild Intermittent Asthma - We will try a form of asthma therapy with use of single inhaler and no albuterol. - Maintenance: continue Singulair '10mg'$  daily.  Stop if there are any mood or behavioral changes. If symptoms worsen or you notice frequent use of Symbicort, then start Symbicort 65mg 2 puffs twice daily and as needed.  - Rescue: Symbicort 858m 2 puff as needed for shortness of breath, wheezing, coughing. You can use 2 puffs prior to exercise as needed.  - If you are 12 or older, maximum is total of 12 puffs in a day.   Asthma control goals:  Full participation in all desired activities (may need albuterol before activity) Cough interfering with sleep two times or less a month Oral steroids no more than once a year No hospitalizations   Allergic rhinitis (grasses, weeds, dust mites, and cat) - Continue with: Xyzal (levocetirizine) '5mg'$  tablet once daily and Singulair (montelukast) '10mg'$  daily - Continue with: Atrovent (ipratropium) 0.03% one spray per nostril 2-3 times daily as needed (CAN BE OVER DRYING) - You can use an extra dose of the antihistamine, if needed, for breakthrough symptoms.  - Consider nasal saline rinses 1-2 times daily to remove allergens from the nasal cavities as well as help with mucous clearance (this is especially helpful to do before the nasal sprays are given)

## 2023-01-01 NOTE — Progress Notes (Signed)
FOLLOW UP Date of Service/Encounter:  01/01/23   Subjective:  Christy Alvarado (DOB: 1986/04/02) is a 37 y.o. female who [facility] to the [facility] on 01/01/2023 for follow up for intermittent asthma and allergic rhinitis.   History obtained from: chart review and patient.  Last visit was with Dr. Ernst Bowler 05/24/2022 for intermittent asthma and allergic rhinitis. On symbicort 159mg 2 puffs daily or twice daily with PRN albuterol and Xyzal/Singulair/Atrovent PRN.   Asthma: Reports being well controlled.  Rarely has SOB/wheezing/coughing and it usually occurs with exercise so she takes Symbicort on those days and pretreats with Albuterol.  This is usually 2-3x/week.  No oral prednisone, ER/urgent care/PCP visits or nighttime symptoms.  Also on singulair daily.   Rhinits: Doing well overall without much congestion, runny nose or drainage. No ocular symptoms. Taking Singulair and Xyzal daily.  Has not required Atrovent nose spray much.   Past Medical History: Past Medical History:  Diagnosis Date   ADD (attention deficit disorder)    Anemia    Anxiety    Asthma    grew out of it, childhood   Depression    Fatigue    Fibroid of cervix    Plantar wart of left foot 04/14/2014   Seasonal allergies    Syncope, vasovagal     Objective:  BP 120/78   Pulse 74   Temp 98.3 F (36.8 C) (Temporal)   Resp 16   Ht '5\' 3"'$  (1.6 m)   Wt 172 lb 4.8 oz (78.2 kg)   SpO2 97%   BMI 30.52 kg/m  Body mass index is 30.52 kg/m. Physical Exam: GEN: alert, well developed HEENT: clear conjunctiva, TM grey and translucent, nose with moderate inferior turbinate hypertrophy, pink nasal mucosa, clear rhinorrhea, no cobblestoning HEART: regular rate and rhythm, no murmur LUNGS: clear to auscultation bilaterally, no coughing, unlabored respiration SKIN: no rashes or lesions Spirometry:  Tracings reviewed. Her effort: Poor effort, data can not be interpreted. FVC: 3.4L FEV1: 1.9L, 63%  predicted FEV1/FVC ratio: 56% Interpretation: Spirometry uninterpretable due to technique.  Please see scanned spirometry results for details.  Assessment:   1. Mild intermittent asthma, uncomplicated   2. Seasonal allergies     Plan/Recommendations:   Mild Intermittent Asthma - Symptomatically doing well, spirometry uninterpretable.  Will try SMART therapy since she is using Symbicort PRN.  - We will try a form of asthma therapy with use of single inhaler and no albuterol. - Maintenance: continue Singulair '10mg'$  daily.  Stop if there are any mood or behavioral changes. If symptoms worsen or you notice frequent use of Symbicort, then start Symbicort 853m 2 puffs twice daily and as needed.  - Rescue: Symbicort 8066m2 puff as needed for shortness of breath, wheezing, coughing. You can use 2 puffs prior to exercise as needed.  - If you are 12 or older, maximum is total of 12 puffs in a day.   Asthma control goals:  Full participation in all desired activities (may need albuterol before activity) Cough interfering with sleep two times or less a month Oral steroids no more than once a year No hospitalizations   Allergic rhinitis (grasses, weeds, dust mites, and cat) - Continue with: Xyzal (levocetirizine) '5mg'$  tablet once daily and Singulair (montelukast) '10mg'$  daily - Continue with: Atrovent (ipratropium) 0.03% one spray per nostril 2-3 times daily as needed (CAN BE OVER DRYING) - You can use an extra dose of the antihistamine, if needed, for breakthrough symptoms.  - Consider  nasal saline rinses 1-2 times daily to remove allergens from the nasal cavities as well as help with mucous clearance (this is especially helpful to do before the nasal sprays are given)    Return in about 6 months (around 07/02/2023).  Harlon Flor, MD [facility]

## 2023-01-09 ENCOUNTER — Other Ambulatory Visit: Payer: Self-pay | Admitting: Internal Medicine

## 2023-01-11 ENCOUNTER — Other Ambulatory Visit: Payer: Self-pay | Admitting: Internal Medicine

## 2023-01-11 ENCOUNTER — Telehealth: Payer: Self-pay

## 2023-01-11 MED ORDER — ALBUTEROL SULFATE HFA 108 (90 BASE) MCG/ACT IN AERS: 2.0000 | INHALATION_SPRAY | Freq: Four times a day (QID) | RESPIRATORY_TRACT | 1 refills | Status: DC | PRN

## 2023-01-11 NOTE — Telephone Encounter (Signed)
PA needed for Wixela. Symbicort did not go through. Pharmacy suggest [facility]. Now Grant Ruts is requiring a PA. Patient has tried budesonide-formoterol (symbicort) 80, 160.

## 2023-01-11 NOTE — Telephone Encounter (Signed)
Sent to PA team.

## 2023-01-11 NOTE — Progress Notes (Signed)
Symbicort is no longer covered by insurance; Grant Ruts is.  Will place order for that.  Unable to do SMART therapy with Grant Ruts so will also send in Albuterol for PRN use.

## 2023-01-15 ENCOUNTER — Other Ambulatory Visit (HOSPITAL_COMMUNITY): Payer: Self-pay

## 2023-01-16 ENCOUNTER — Telehealth: Payer: Self-pay | Admitting: Registered Nurse

## 2023-01-16 NOTE — Telephone Encounter (Signed)
Called the pt to get her set-up for an appointment so that she is able to get her refill that she is needing.  Pt was last seen on 05/08/2022 Maximiano Coss, NP

## 2023-01-18 ENCOUNTER — Other Ambulatory Visit (HOSPITAL_COMMUNITY): Payer: Self-pay

## 2023-01-18 NOTE — Telephone Encounter (Signed)
Per test claim PA is not needed for Wixela at this time and is showing paid with a refill too soon message.

## 2023-04-25 ENCOUNTER — Ambulatory Visit: Payer: 59 | Admitting: Obstetrics and Gynecology

## 2023-05-14 ENCOUNTER — Encounter: Payer: Self-pay | Admitting: Internal Medicine

## 2023-05-14 ENCOUNTER — Other Ambulatory Visit: Payer: Self-pay

## 2023-05-14 DIAGNOSIS — F988 Other specified behavioral and emotional disorders with onset usually occurring in childhood and adolescence: Secondary | ICD-10-CM

## 2023-05-14 DIAGNOSIS — F32A Depression, unspecified: Secondary | ICD-10-CM

## 2023-05-14 MED ORDER — BUPROPION HCL ER (SR) 150 MG PO TB12: 150.0000 mg | ORAL_TABLET | Freq: Two times a day (BID) | ORAL | 0 refills | Status: DC

## 2023-05-16 NOTE — Telephone Encounter (Signed)
Unfortunately I cannot send a refill in until she establishes care with the practice. She can contact her prior PCP office to see if they would be willing to fill it since she is still currently under their care until she establishes here.

## 2023-05-28 ENCOUNTER — Encounter: Payer: Self-pay | Admitting: Internal Medicine

## 2023-05-28 ENCOUNTER — Ambulatory Visit: Payer: 59 | Admitting: Internal Medicine

## 2023-05-28 VITALS — BP 116/78 | HR 74 | Temp 99.1°F | Ht 63.0 in | Wt 177.6 lb

## 2023-05-28 DIAGNOSIS — F411 Generalized anxiety disorder: Secondary | ICD-10-CM | POA: Diagnosis not present

## 2023-05-28 DIAGNOSIS — F320 Major depressive disorder, single episode, mild: Secondary | ICD-10-CM | POA: Diagnosis not present

## 2023-05-28 DIAGNOSIS — M25552 Pain in left hip: Secondary | ICD-10-CM | POA: Insufficient documentation

## 2023-05-28 DIAGNOSIS — F988 Other specified behavioral and emotional disorders with onset usually occurring in childhood and adolescence: Secondary | ICD-10-CM

## 2023-05-28 MED ORDER — METHYLPHENIDATE HCL ER 27 MG PO TB24: 27.0000 mg | ORAL_TABLET | Freq: Every day | ORAL | 0 refills | Status: DC

## 2023-05-28 MED ORDER — BUPROPION HCL ER (SR) 150 MG PO TB12
150.0000 mg | ORAL_TABLET | Freq: Two times a day (BID) | ORAL | 1 refills | Status: DC
Start: 2023-05-28 — End: 2023-07-09

## 2023-05-28 MED ORDER — FLUOXETINE HCL 20 MG PO TABS: 20.0000 mg | ORAL_TABLET | Freq: Every day | ORAL | 1 refills | Status: DC

## 2023-05-28 MED ORDER — PREDNISONE 10 MG (21) PO TBPK
ORAL_TABLET | ORAL | 0 refills | Status: DC
Start: 2023-05-28 — End: 2023-06-26

## 2023-05-28 MED ORDER — METHYLPHENIDATE HCL ER 27 MG PO TB24
27.0000 mg | ORAL_TABLET | Freq: Every day | ORAL | 0 refills | Status: DC
Start: 2023-05-28 — End: 2023-07-02

## 2023-05-28 MED ORDER — METHOCARBAMOL 500 MG PO TABS
500.0000 mg | ORAL_TABLET | Freq: Three times a day (TID) | ORAL | 0 refills | Status: DC | PRN
Start: 2023-05-28 — End: 2023-07-09

## 2023-05-28 NOTE — Patient Instructions (Signed)
Continue PT, ice and heat therapy  Ibuprofen 800mg  ( 4 tablets of 200mg ) every 8 hours as needed for hip pain. Take with food.

## 2023-05-28 NOTE — Assessment & Plan Note (Signed)
Possible bursitis  Will treat with NSAIDS, steroid pack, muscle relaxant  Continue ice and heat therapy  Continue PT  XR ordered

## 2023-05-28 NOTE — Assessment & Plan Note (Signed)
Initiate prozac 20mg  PO daily

## 2023-05-28 NOTE — Assessment & Plan Note (Addendum)
Continue concerta 27mg  UDS ordered  Controlled substance agreement signed

## 2023-05-28 NOTE — Assessment & Plan Note (Signed)
Continue Wellbutrin 150mg  BID

## 2023-05-28 NOTE — Progress Notes (Signed)
[facility] [address] [place] [address] Dept: [phone] Dept Fax: [phone]  New Patient Office Visit  Subjective:   Christy Alvarado 13-Jul-1986 05/28/2023  Chief Complaint  Patient presents with   Establish Care    HPI: Christy Alvarado presents today to establish care at [facility]. Introduced to Publishing rights manager role and practice setting.  All questions answered.   Last PCP: [name] NP Last annual physical: unknown Concerns: See below   ADD:  Patient presents for the medical management of ADD/ADHD.  Current medication regimen: Concerta 27mg  PO daily  Medication compliance:  good compliance ADHD status: controlled Controlled substance contract: no - obtaining today  DEPRESSION and ANXIETY: [name] presents for the medical management of depression and anxiety.  Current medication regimen: Wellbutrin 150mg  PO BID.  Counseling: Not currently. She reports finding herself being upset or agitated over small instances that occur on a day to day basis. No specific triggers.  Was on Prozac in the past which helped balance her mood along with the wellbutrin.  Well controlled: no Denies SI/HI.     05/28/2023    4:18 PM 05/08/2022    9:49 AM 11/10/2021    8:47 AM  PHQ9 SCORE ONLY  PHQ-9 Total Score 2 0 0   HIP PAIN: Patient c/o tightness in left hip after hamstring injury in Feb 2023.  Activity or laying on affected side worsens pain. She was an avid runner prior to hamstring injury.  Is currently in PT. Has been treating hip for 5 weeks in PT with no significant improvement.  Has been using ice, heat.  Denies numbness in extremities, bowel/bladder incontinence.   The following portions of the patient's history were reviewed and updated as appropriate: past medical history, past surgical history, family history, social history, allergies, medications, and problem list.    Patient Active Problem List   Diagnosis Date Noted   Pain of left hip 05/28/2023   Generalized anxiety disorder 05/28/2023   Depression, major, single episode, mild (HCC) 05/28/2023   Mild intermittent asthma, uncomplicated 02/15/2022   Seasonal and perennial allergic rhinitis 02/15/2022   Food intolerance 02/15/2022   Iron deficiency anemia 09/01/2012   Exercise-induced asthma 09/01/2012   ADD (attention deficit disorder)    Past Medical History:  Diagnosis Date   ADD (attention deficit disorder)    Anemia    Anxiety    Asthma    grew out of it, childhood   Depression    Fatigue    Fibroid of cervix    Plantar wart of left foot 04/14/2014   Seasonal allergies    Syncope, vasovagal    Past Surgical History:  Procedure Laterality Date   HYSTEROSCOPY WITH D & C N/A 08/12/2018   Procedure: DILATATION AND CURETTAGE /HYSTEROSCOPY;  Surgeon: Romualdo Bolk, MD;  Location: [facility];  Service: Gynecology;  Laterality: N/A;   NASAL SEPTUM SURGERY     PROLAPSED UTERINE FIBROID LIGATION N/A 08/12/2018   Procedure: MYOMECTOMY CERVICAL;  Surgeon: Romualdo Bolk, MD;  Location: [facility];  Service: Gynecology;  Laterality: N/A;   RECONSTRUCTION MANDIBLE / MAXILLA     underbite correction   Family History  Problem Relation Age of Onset   Cancer Father        prostate   Stroke Father    Heart disease Maternal Grandmother    Hyperlipidemia Maternal Grandmother    Stroke Maternal Grandmother  Hypertension Maternal Grandmother    Outpatient Medications Prior to Visit  Medication Sig Dispense Refill   albuterol (VENTOLIN HFA) 108 (90 Base) MCG/ACT inhaler Inhale 2 puffs into the lungs every 6 (six) hours as needed. (Patient not taking: Reported on 05/28/2023) 8 g 1   fluticasone-salmeterol (WIXELA INHUB) 100-50 MCG/ACT AEPB Inhale 1 puff into the lungs 2 (two) times daily. 60 each 5   levonorgestrel (MIRENA) 20 MCG/24HR IUD 1 each by  Intrauterine route once.     montelukast (SINGULAIR) 10 MG tablet Take 1 tablet (10 mg total) by mouth at bedtime. 90 tablet 1   buPROPion (WELLBUTRIN SR) 150 MG 12 hr tablet Take 1 tablet (150 mg total) by mouth 2 (two) times daily. 60 tablet 0   methylphenidate 27 MG PO TB24 Take 1 tablet (27 mg total) by mouth daily. 30 tablet 0   ipratropium (ATROVENT) 0.03 % nasal spray Place 1 spray per nostril 2-3 times daily as needed (Patient not taking: Reported on 05/28/2023) 30 mL 5   levocetirizine (XYZAL) 5 MG tablet Take 1 tablet (5 mg total) by mouth in the morning. 30 tablet 5   FLUoxetine (PROZAC) 20 MG tablet Take 20 mg by mouth daily. (Patient not taking: Reported on 05/28/2023)     No facility-administered medications prior to visit.   No Known Allergies  ROS: A complete ROS was performed with pertinent positives/negatives noted in the HPI. The remainder of the ROS are negative.   Objective:   Today's Vitals   05/28/23 1543  BP: 116/78  Pulse: 74  Temp: 99.1 F (37.3 C)  TempSrc: Temporal  SpO2: 98%  Weight: 177 lb 9.6 oz (80.6 kg)  Height: 5\' 3"  (1.6 m)    GENERAL: Well-appearing, in NAD. Well nourished.  SKIN: Pink, warm and dry. No rash, lesion, ulceration, or ecchymoses.  NECK: Trachea midline. Full ROM w/o pain or tenderness. No lymphadenopathy.  RESPIRATORY: Chest wall symmetrical. Respirations even and non-labored. Breath sounds clear to auscultation bilaterally.  CARDIAC: S1, S2 present, regular rate and rhythm. Peripheral pulses 2+ bilaterally.  MSK: Muscle tone and strength appropriate for age. Joints w/o redness, or swelling.  EXTREMITIES: Without clubbing, cyanosis, or edema. FROM left hip, negative faber. Pain reproduced with palpation to left greater trochanteric region and with LLE abduction  NEUROLOGIC: No motor or sensory deficits. Steady, even gait.  PSYCH/MENTAL STATUS: Alert, oriented x 3. Cooperative,tearful affect.    There are no preventive care  reminders to display for this patient.   No results found for any visits on 05/28/23.     Assessment & Plan:  Attention deficit disorder, unspecified hyperactivity presence Assessment & Plan: Continue concerta 27mg  UDS ordered  Controlled substance agreement signed   Orders: -     Urine drugs of abuse scrn w alc, routine (Ref Lab) -     buPROPion HCl ER (SR); Take 1 tablet (150 mg total) by mouth 2 (two) times daily.  Dispense: 180 tablet; Refill: 1 -     Methylphenidate HCl ER; Take 1 tablet (27 mg total) by mouth daily.  Dispense: 30 tablet; Refill: 0  Generalized anxiety disorder Assessment & Plan: Initiate prozac 20mg  PO daily   Orders: -     FLUoxetine HCl; Take 1 tablet (20 mg total) by mouth daily.  Dispense: 90 tablet; Refill: 1  Depression, major, single episode, mild (HCC) Assessment & Plan: Continue Wellbutrin 150mg  BID   Orders: -     buPROPion HCl ER (SR); Take 1 tablet (150  mg total) by mouth 2 (two) times daily.  Dispense: 180 tablet; Refill: 1  Pain of left hip Assessment & Plan: Possible bursitis  Will treat with NSAIDS, steroid pack, muscle relaxant  Continue ice and heat therapy  Continue PT  XR ordered   Orders: -     DG HIP UNILAT W OR W/O PELVIS 2-3 VIEWS LEFT; Future -     Methocarbamol; Take 1 tablet (500 mg total) by mouth 3 (three) times daily as needed for muscle spasms.  Dispense: 30 tablet; Refill: 0 -     predniSONE; Take as directed on packaging.  Dispense: 21 tablet; Refill: 0     Return in about 6 weeks (around 07/09/2023) for Anxiety/Depression, hip pain.   Salvatore Decent, FNP

## 2023-05-30 LAB — URINE DRUGS OF ABUSE SCREEN W ALC, ROUTINE (REF LAB)
Amphetamines, Urine: NEGATIVE ng/mL
Barbiturate Quant, Ur: NEGATIVE ng/mL
Benzodiazepine Quant, Ur: NEGATIVE ng/mL
Cannabinoid Quant, Ur: NEGATIVE ng/mL
Cocaine (Metab.): NEGATIVE ng/mL
Ethanol, Urine: NEGATIVE %
Methadone Screen, Urine: NEGATIVE ng/mL
Opiate Quant, Ur: NEGATIVE ng/mL
PCP Quant, Ur: NEGATIVE ng/mL
Propoxyphene: NEGATIVE ng/mL

## 2023-05-30 NOTE — Progress Notes (Signed)
Urine drug screen clear

## 2023-06-26 ENCOUNTER — Ambulatory Visit: Payer: Managed Care, Other (non HMO) | Admitting: Radiology

## 2023-06-26 VITALS — BP 124/84

## 2023-06-26 DIAGNOSIS — Z30432 Encounter for removal of intrauterine contraceptive device: Secondary | ICD-10-CM | POA: Diagnosis not present

## 2023-06-26 NOTE — Progress Notes (Signed)
   Christy Alvarado [birth year] [id number]   History:  37 y.o. G0 presents for removal of Mirena IUD.  Reason for discontinuation: desires pregnancy Pt has been counseled about risks and benefits as well as complications.  Consent is obtained today.  No LMP recorded. (Menstrual status: IUD).   Past medical history, past surgical history, family history and social history were all reviewed and documented in the EPIC chart.  ROS:  A ROS was performed and pertinent positives and negatives are included.  Exam: Vitals:   06/26/23 1124  BP: 124/84   There is no height or weight on file to calculate BMI.  Pelvic exam: Vulva:  normal female genitalia Vagina:  normal vagina, no discharge, exudate, lesion, or erythema Cervix:  Non-tender, Negative CMT, no lesions or redness. IUD strings visualized. Uterus:  normal shape, position and consistency    Procedure:  Speculum inserted.   Cervix visualized, IUD grasps with forceps and removed without difficulty. Pt tolerated procedure well.   Chaperone, Raynelle Fanning, CMA present for exam   Assessment/Plan: 1. Encounter for IUD removal Begin PNV Discussed which meds are not safe to use during pregnancy, will talk to her prescriber about switching meds   Pt aware to call for any concerns, return to office as schedule for annual exam or as needed. Schedule AEX  Tanda Rockers WHNP-BC, 11:33 AM 06/26/2023

## 2023-07-02 ENCOUNTER — Ambulatory Visit: Payer: Managed Care, Other (non HMO) | Admitting: Allergy & Immunology

## 2023-07-02 ENCOUNTER — Other Ambulatory Visit: Payer: Self-pay

## 2023-07-02 ENCOUNTER — Encounter: Payer: Self-pay | Admitting: Allergy & Immunology

## 2023-07-02 VITALS — BP 126/80 | HR 85 | Temp 98.5°F | Resp 18 | Ht 63.39 in | Wt 180.7 lb

## 2023-07-02 DIAGNOSIS — J302 Other seasonal allergic rhinitis: Secondary | ICD-10-CM | POA: Diagnosis not present

## 2023-07-02 DIAGNOSIS — J4520 Mild intermittent asthma, uncomplicated: Secondary | ICD-10-CM

## 2023-07-02 DIAGNOSIS — J3089 Other allergic rhinitis: Secondary | ICD-10-CM

## 2023-07-02 MED ORDER — FLUTICASONE-SALMETEROL 100-50 MCG/ACT IN AEPB: 1.0000 | INHALATION_SPRAY | Freq: Every day | RESPIRATORY_TRACT | 11 refills | Status: AC

## 2023-07-02 MED ORDER — AIRSUPRA 90-80 MCG/ACT IN AERO: 2.0000 | INHALATION_SPRAY | RESPIRATORY_TRACT | 5 refills | Status: AC | PRN

## 2023-07-02 MED ORDER — MONTELUKAST SODIUM 10 MG PO TABS: 10.0000 mg | ORAL_TABLET | Freq: Every day | ORAL | 3 refills | Status: AC

## 2023-07-02 NOTE — Progress Notes (Signed)
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 Mild intermittent asthma, uncomplicated - Lung testing not done today.  - We are going to give you a sample of AirSupra to use in place of albuterol.  - Zero copay card provided.  - Daily controller medication(s): Advair 100/46mcg one puff once daily - Prior to physical activity: AirSupra 2 puffs 10-15 minutes before physical activity. - Rescue medications: AirSupra 4 puffs every 4-6 hours as needed - Asthma control goals:  * Full participation in all desired activities (may need albuterol before activity) * Albuterol use two time or less a week on average (not counting use with activity) * Cough interfering with sleep two time or less a month * Oral steroids no more than once a year * No hospitalizations  2. Chronic rhinitis (grasses, weeds, dust mites, and cat) - Continue with: Xyzal (levocetirizine) 5mg  tablet once daily and Singulair (montelukast) 10mg  daily - Continue with: Atrovent (ipratropium) 0.03% one spray per nostril 2-3 times daily AS NEEDED (CAN BE OVER DRYING) - You can use an extra dose of the antihistamine, if needed, for breakthrough symptoms.   3. Return in about 1 year (around 07/01/2024).    Subjective:   Christy Alvarado is a 37 y.o. female presenting today for follow up of  Chief Complaint  Patient presents with   Asthma    Christy Alvarado has a history of the following: Patient Active Problem List   Diagnosis Date Noted   Pain of left hip 05/28/2023   Generalized anxiety disorder 05/28/2023   Depression, major, single episode, mild (HCC) 05/28/2023   Mild intermittent asthma, uncomplicated 02/15/2022   Seasonal and perennial  allergic rhinitis 02/15/2022   Food intolerance 02/15/2022   Iron deficiency anemia 09/01/2012   Exercise-induced asthma 09/01/2012   ADD (attention deficit disorder)     History obtained from: chart review and patient.  Christy Alvarado is a 37 y.o. female presenting for a follow up visit.  She was last seen in January 2024.  At that time, she was doing very well with Symbicort used via the SMART regimen.  For her allergic rhinitis, she was continued on Xyzal as well as Singulair and Atrovent every 8 hours as needed.  Since last visit, she has largely done well.   Asthma/Respiratory Symptom History: Breathing is under excellent control. She has not been exercising as much due to hip troubles.  She was changed to as needed Symbicort, but it seems that she is now on Wixela 100/50 one puff once daily. She is doing a steroid injection next weekend. She did something with her hamstring. Albuterol is affordable. She is not using it often at all, rarely if at all.   Allergic Rhinitis Symptom History: She remains on the Xyzal once daily. This is doing the trick with regards to her allergic rhinitis. She has not had any sinus infections at all.   She stopped her methylphenidate. She has a gap of PCP coverage. She has not had methylphenidate for a while. She had her IUD removed in anticipate of having children. She is trying to stop the methylphenidate for the pregnancy.   She is no longer working for the [employer]. 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 She is now married at the [employer]. Her husband is an optometrist.   Otherwise, there have been no changes to her past medical history, surgical history, family history, or social history.    Review of systems otherwise negative other than that mentioned in the HPI.    Objective:   Blood pressure 126/80, pulse 85, temperature 98.5 F (36.9 C), resp. rate 18, height 5' 3.39" (1.61 m), weight 180 lb 11.2 oz (82 kg), SpO2  97%. Body mass index is 31.62 kg/m.    Physical Exam Vitals reviewed.  Constitutional:      Appearance: She is well-developed.  HENT:     Head: Normocephalic and atraumatic.     Right Ear: Tympanic membrane, ear canal and external ear normal. No drainage, swelling or tenderness. Tympanic membrane is not injected, scarred, erythematous, retracted or bulging.     Left Ear: Tympanic membrane, ear canal and external ear normal. No drainage, swelling or tenderness. Tympanic membrane is not injected, scarred, erythematous, retracted or bulging.     Nose: No nasal deformity, septal deviation, mucosal edema or rhinorrhea.     Right Turbinates: Enlarged, swollen and pale.     Left Turbinates: Enlarged, swollen and pale.     Right Sinus: No maxillary sinus tenderness or frontal sinus tenderness.     Left Sinus: No maxillary sinus tenderness or frontal sinus tenderness.     Comments: No nasal polyps.    Mouth/Throat:     Lips: Pink.     Mouth: Mucous membranes are moist. Mucous membranes are not pale and not dry.     Pharynx: Uvula midline.  Eyes:     General: Lids are normal. No allergic shiner.       Right eye: No discharge.        Left eye: No discharge.     Conjunctiva/sclera: Conjunctivae normal.     Right eye: Right conjunctiva is not injected. No chemosis.    Left eye: Left conjunctiva is not injected. No chemosis.    Pupils: Pupils are equal, round, and reactive to light.  Cardiovascular:     Rate and Rhythm: Normal rate and regular rhythm.     Heart sounds: Normal heart sounds.  Pulmonary:     Effort: Pulmonary effort is normal. No tachypnea, accessory muscle usage or respiratory distress.     Breath sounds: Normal breath sounds. No wheezing, rhonchi or rales.  Chest:     Chest wall: No tenderness.  Abdominal:     Tenderness: There is no abdominal tenderness. There is no guarding or rebound.  Lymphadenopathy:     Head:     Right side of head: No submandibular, tonsillar or  occipital adenopathy.     Left side of head: No submandibular, tonsillar or occipital adenopathy.     Cervical: No cervical adenopathy.  Skin:    Coloration: Skin is not pale.     Findings: No abrasion, erythema, petechiae or rash. Rash is not papular, urticarial or vesicular.  Neurological:     Mental Status: She is alert.  Psychiatric:        Behavior: Behavior is cooperative.      Diagnostic studies: none     Malachi Bonds, MD  [facility]

## 2023-07-02 NOTE — Patient Instructions (Addendum)
1. Mild intermittent asthma, uncomplicated - Lung testing not done today.  - We are going to give you a sample of AirSupra to use in place of albuterol.  - Zero copay card provided.  - Daily controller medication(s): Advair 100/49mcg one puff once daily - Prior to physical activity: AirSupra 2 puffs 10-15 minutes before physical activity. - Rescue medications: AirSupra 4 puffs every 4-6 hours as needed - Asthma control goals:  * Full participation in all desired activities (may need albuterol before activity) * Albuterol use two time or less a week on average (not counting use with activity) * Cough interfering with sleep two time or less a month * Oral steroids no more than once a year * No hospitalizations  2. Chronic rhinitis (grasses, weeds, dust mites, and cat) - Continue with: Xyzal (levocetirizine) 5mg  tablet once daily and Singulair (montelukast) 10mg  daily - Continue with: Atrovent (ipratropium) 0.03% one spray per nostril 2-3 times daily AS NEEDED (CAN BE OVER DRYING) - You can use an extra dose of the antihistamine, if needed, for breakthrough symptoms.   3. Return in about 1 year (around 07/01/2024).    Please inform us of any Emergency Department visits, hospitalizations, or changes in symptoms. Call us before going to the ED for breathing or allergy symptoms since we might be able to fit you in for a sick visit. Feel free to contact us anytime with any questions, problems, or concerns.  It was a pleasure to see you again today!  Websites that have reliable patient information: 1. American Academy of Asthma, Allergy, and Immunology: www.aaaai.org 2. Food Allergy Research and Education (FARE): foodallergy.org 3. Mothers of Asthmatics: http://www.asthmacommunitynetwork.org 4. American College of Allergy, Asthma, and Immunology: www.acaai.org   COVID-19 Vaccine Information can be found at: [email or web address] For  questions related to vaccine distribution or appointments, please email [email or web address] or call [phone].   We realize that you might be concerned about having an allergic reaction to the COVID19 vaccines. To help with that concern, WE ARE OFFERING THE COVID19 VACCINES IN OUR OFFICE! Ask the front desk for dates!     "Like" [place] on Facebook and Instagram for our latest updates!      A healthy democracy works best when [employer] participate! Make sure you are registered to vote! If you have moved or changed any of your contact information, you will need to get this updated before voting!  In some cases, you MAY be able to register to vote online: [email or web address]

## 2023-07-04 ENCOUNTER — Other Ambulatory Visit (HOSPITAL_COMMUNITY): Payer: Self-pay

## 2023-07-08 ENCOUNTER — Telehealth: Payer: Self-pay

## 2023-07-08 ENCOUNTER — Other Ambulatory Visit (HOSPITAL_COMMUNITY): Payer: Self-pay

## 2023-07-08 NOTE — Telephone Encounter (Signed)
Pharmacy Patient Advocate Encounter   Received notification from CoverMyMeds that prior authorization for Airsupra 90-80MCG/ACT aerosol is required/requested.   Insurance verification completed.   The patient is insured through [employer] .   Per test claim: PA required; PA submitted to [employer] via CoverMyMeds Key/confirmation #/EOC [id number] Status is pending

## 2023-07-09 ENCOUNTER — Ambulatory Visit: Payer: Managed Care, Other (non HMO) | Admitting: Internal Medicine

## 2023-07-09 ENCOUNTER — Encounter: Payer: Self-pay | Admitting: Internal Medicine

## 2023-07-09 VITALS — BP 116/74 | HR 102 | Temp 98.4°F | Ht 63.0 in | Wt 180.4 lb

## 2023-07-09 DIAGNOSIS — F320 Major depressive disorder, single episode, mild: Secondary | ICD-10-CM

## 2023-07-09 DIAGNOSIS — F988 Other specified behavioral and emotional disorders with onset usually occurring in childhood and adolescence: Secondary | ICD-10-CM

## 2023-07-09 DIAGNOSIS — F411 Generalized anxiety disorder: Secondary | ICD-10-CM | POA: Diagnosis not present

## 2023-07-09 MED ORDER — MELOXICAM 15 MG PO TABS: 15.0000 mg | ORAL_TABLET | Freq: Every day | ORAL | Status: DC

## 2023-07-09 MED ORDER — SERTRALINE HCL 50 MG PO TABS
ORAL_TABLET | ORAL | 3 refills | Status: DC
Start: 2023-07-09 — End: 2024-07-13

## 2023-07-09 NOTE — Progress Notes (Signed)
[facility] [address] [place] [address] Dept: [phone] Dept Fax: [phone]    Subjective:   Christy Alvarado 1986-09-18 07/09/2023  Chief Complaint  Patient presents with   Follow-up    Mediation change    HPI: Christy Alvarado presents today for re-assessment and management of chronic medical conditions.  Discussed the use of AI scribe software for clinical note transcription with the patient, who gave verbal consent to proceed.  History of Present Illness   The patient, with a history of depression, ADHD, presents for medication management in preparation for pregnancy. They report feeling much better on their current regimen of Wellbutrin 150 mg twice daily and Prozac 20 mg daily. They have not been taking Concerta due to insurance issues and have been managing their ADHD symptoms without it.  They recently had their IUD removed and are considering trying to conceive.   The following portions of the patient's history were reviewed and updated as appropriate: past medical history, past surgical history, family history, social history, allergies, medications, and problem list.   Patient Active Problem List   Diagnosis Date Noted   Pain of left hip 05/28/2023   Generalized anxiety disorder 05/28/2023   Depression, major, single episode, mild (HCC) 05/28/2023   Mild intermittent asthma, uncomplicated 02/15/2022   Seasonal and perennial allergic rhinitis 02/15/2022   Food intolerance 02/15/2022   Iron deficiency anemia 09/01/2012   Exercise-induced asthma 09/01/2012   ADD (attention deficit disorder)    Past Medical History:  Diagnosis Date   ADD (attention deficit disorder)    Anemia    Anxiety    Asthma    grew out of it, childhood   Depression    Fatigue    Fibroid of cervix    Plantar wart of left foot 04/14/2014   Seasonal allergies    Syncope, vasovagal    Past Surgical History:  Procedure  Laterality Date   HYSTEROSCOPY WITH D & C N/A 08/12/2018   Procedure: DILATATION AND CURETTAGE /HYSTEROSCOPY;  Surgeon: Romualdo Bolk, MD;  Location: [facility];  Service: Gynecology;  Laterality: N/A;   NASAL SEPTUM SURGERY     PROLAPSED UTERINE FIBROID LIGATION N/A 08/12/2018   Procedure: MYOMECTOMY CERVICAL;  Surgeon: Romualdo Bolk, MD;  Location[facility];  Service: Gynecology;  Laterality: N/A;   RECONSTRUCTION MANDIBLE / MAXILLA     underbite correction   Family History  Problem Relation Age of Onset   Cancer Father        prostate   Stroke Father    Urticaria Maternal Aunt    Heart disease Maternal Grandmother    Hyperlipidemia Maternal Grandmother    Stroke Maternal Grandmother    Hypertension Maternal Grandmother    Outpatient Medications Prior to Visit  Medication Sig Dispense Refill   Albuterol-Budesonide (AIRSUPRA) 90-80 MCG/ACT AERO Inhale 2 puffs into the lungs every 4 (four) hours as needed. 10.7 g 5   fluticasone-salmeterol (WIXELA INHUB) 100-50 MCG/ACT AEPB Inhale 1 puff into the lungs daily. 60 each 11   levocetirizine (XYZAL) 5 MG tablet Take 1 tablet (5 mg total) by mouth in the morning. 30 tablet 5   montelukast (SINGULAIR) 10 MG tablet Take 1 tablet (10 mg total) by mouth at bedtime. 90 tablet 3   buPROPion (WELLBUTRIN SR) 150 MG 12 hr tablet Take 1 tablet (150 mg total) by mouth 2 (two) times daily. 180 tablet 1   FLUoxetine (PROZAC) 20  MG tablet Take 1 tablet (20 mg total) by mouth daily. 90 tablet 1   meloxicam (MOBIC) 15 MG tablet Take 15 mg by mouth daily.     methocarbamol (ROBAXIN) 500 MG tablet Take 1 tablet (500 mg total) by mouth 3 (three) times daily as needed for muscle spasms. (Patient not taking: Reported on 07/09/2023) 30 tablet 0   No facility-administered medications prior to visit.   No Known Allergies   ROS: A complete ROS was performed with pertinent positives/negatives noted in the HPI. The  remainder of the ROS are negative.    Objective:   Today's Vitals   07/09/23 1502  BP: 116/74  Pulse: (!) 102  Temp: 98.4 F (36.9 C)  TempSrc: Temporal  SpO2: 98%  Weight: 180 lb 6.4 oz (81.8 kg)  Height: 5\' 3"  (1.6 m)    GENERAL: Well-appearing, in NAD. Well nourished.  SKIN: Pink, warm and dry.  RESPIRATORY: Chest wall symmetrical. Respirations even and non-labored.  PSYCH/MENTAL STATUS: Alert, oriented x 3. Cooperative, appropriate mood and affect.   Health Maintenance Due  Topic Date Due   INFLUENZA VACCINE  07/04/2023    No results found for any visits on 07/09/23.  The ASCVD Risk score (Arnett DK, et al., 2019) failed to calculate for the following reasons:   The 2019 ASCVD risk score is only valid for ages 45 to 47     Assessment & Plan:  Assessment and Plan    Depression Stable on Wellbutrin 150mg  BID and Prozac 20mg  daily. Planning for pregnancy and need to adjust medications accordingly. Discussed switching from Prozac to Zoloft and tapering off Wellbutrin. -Switch Prozac to Zoloft 50mg  daily (start with 25mg  daily for 5 days then increase to 50mg  daily). -Taper Wellbutrin over 6 weeks (1 tablet daily for 2 weeks, then half tablet daily for 2 weeks, then half tablet every other day for 2 weeks).  Attention Deficit Disorder Not currently taking Concerta due to planning for pregnancy. Patient reports managing well without it. -Continue to hold off on Concerta.  General Health Maintenance / Followup Plans -Annual physical scheduled with OB/GYN in October. -Plan for follow-up in 6 months, unless patient becomes pregnant, in which case care will be transferred to OB/GYN until postpartum period.       Return in about 6 months (around 01/09/2024) for Chronic Condition follow up.   Of note, portions of this note may have been created with voice recognition software Physicist, medical). While this note has been edited for accuracy, occasional wrong-word or  'sound-a-like' substitutions may have occurred due to the inherent limitations of voice recognition software.  Salvatore Decent, FNP

## 2023-07-09 NOTE — Patient Instructions (Addendum)
Taper Wellbutrin:  1 tablet by mouth once daily for 2 weeks  1/2 tablet by mouth once daily for 2 weeks  1/2 tablet every other day for 2 weeks.  Then STOP.

## 2023-07-11 NOTE — Telephone Encounter (Signed)
Pharmacy Patient Advocate Encounter  Received notification from [employer] that Prior Authorization for Airsupra 90-80MCG/ACT aerosol has been DENIED. Please advise how you'd like to proceed. Full denial letter will be uploaded to the media tab. See denial reason below.  There is nothing to support that the individual has had failure, contraindication, or intolerance to one of the following covered alternatives: a. Dulera (mometasone/formoterol); b. Budesonide/formoterol (generic for Symbicort). There is nothing to support that the individual has documentation of failure, contraindication, or intolerance to one albuterol-containing inhaler (for example, Xopenex HFA, Proventil HFA, albuterol HFA, Ventolin HFA )  or levalbuterol-containing inhaler (for example, Xopenex HFA and levalbuterol HFA) take concomitantly with one single-entity inhaled corticosteroid (Alvesco, ArmonAir Digihaler, Arnuity Ellipta, Asmanex HFA, Asmanex Twisthaler, Flovent Diskus, Flovent HFA, Pulmicort Flexhaler, Qvar RediHaler). Paulene Floor is considered medically necessary when the following are met: As-needed treatment or prevention of bronchoconstriction and to reduce the risk of exacerbations. Individual meets all of the following criteria: 1. Age 35 years and older; 2. Documented diagnosis of asthma; 3. Documentation of failure, contraindication, or intolerance to one of the following: a. Dulera (mometasone/formoterol); b. Budesonide/formoterol (generic for Symbicort); 4. Documentation of failure, contraindication, or intolerance to one albuterol-containing inhaler (for example, ProAir HFA, Proventil HFA, albuterol HFA, Ventolin HFA) or levalbuterol-containing inhaler (for example, Xopenex HFA and levalbuterol HFA) taken concomitantly with one single-entity inhaled corticosteroid (Alvesco, ArmonAir Digihaler, Arnuity Ellipta, Asmaxex HFA, Asmanex Twisthaler, Flovent Diskus, Flovent HFA, Pulmicort Flexhaler, Qvar RediHaler). Any other  exception is considered not medically necessary.  PA #/Case ID/Reference #: [id number]   Please be advised we currently do not have a Pharmacist to review denials, therefore you will need to process appeals accordingly as needed. Thanks for your support at this time. Contact for appeals are as follows: Phone: [phone], Fax: [phone]

## 2023-07-15 NOTE — Telephone Encounter (Signed)
Let's call the pharmacy and make sure that they are running the card correctly. Our reps have told us that the copay card with work even with denials.   Malachi Bonds, MD [facility]

## 2023-07-17 NOTE — Telephone Encounter (Signed)
Let's include this in a Huddle next week. This should be an automatic reply when we get these messages about AirSupra.   Thanks for calling, Trayce!   Malachi Bonds, MD [facility]

## 2023-09-04 ENCOUNTER — Ambulatory Visit (INDEPENDENT_AMBULATORY_CARE_PROVIDER_SITE_OTHER): Payer: Managed Care, Other (non HMO) | Admitting: Radiology

## 2023-09-04 ENCOUNTER — Encounter: Payer: Self-pay | Admitting: Radiology

## 2023-09-04 VITALS — BP 124/88 | Ht 63.0 in | Wt 185.0 lb

## 2023-09-04 DIAGNOSIS — Z01419 Encounter for gynecological examination (general) (routine) without abnormal findings: Secondary | ICD-10-CM

## 2023-09-04 DIAGNOSIS — R635 Abnormal weight gain: Secondary | ICD-10-CM

## 2023-09-04 DIAGNOSIS — Z13220 Encounter for screening for lipoid disorders: Secondary | ICD-10-CM | POA: Diagnosis not present

## 2023-09-04 NOTE — Progress Notes (Signed)
   Christy Alvarado 1986-04-08 [id number]   History:  37 y.o. G0 presents for annual exam. C/o weight gain. Exercising regularly. Has a hip injury so she is doing well cardio. Has an MRI scheduled. Had IUD removed 7/24, most recent period was regular. Plans to start trying for pregnancy soon, taking PNV. Would like fasting labs today.  Gynecologic History Patient's last menstrual period was 08/16/2023 (exact date). Period Cycle (Days): 28 Period Duration (Days): 5 Period Pattern: Regular Menstrual Flow: Heavy Menstrual Control: Tampon Dysmenorrhea: (!) Mild Dysmenorrhea Symptoms: Cramping Contraception/Family planning: none Sexually active: yes Last Pap: 2022. Results were: normal   Obstetric History OB History  Gravida Para Term Preterm AB Living  0 0 0 0 0 0  SAB IAB Ectopic Multiple Live Births  0 0 0 0       The following portions of the patient's history were reviewed and updated as appropriate: allergies, current medications, past family history, past medical history, past social history, past surgical history, and problem list.  Review of Systems Pertinent items noted in HPI and remainder of comprehensive ROS otherwise negative.   Past medical history, past surgical history, family history and social history were all reviewed and documented in the EPIC chart.   Exam:  Vitals:   09/04/23 0800  BP: 124/88  Weight: 185 lb (83.9 kg)  Height: 5\' 3"  (1.6 m)   Body mass index is 32.77 kg/m.  General appearance:  Normal Thyroid:  Symmetrical, normal in size, without palpable masses or nodularity. Respiratory  Auscultation:  Clear without wheezing or rhonchi Cardiovascular  Auscultation:  Regular rate, without rubs, murmurs or gallops  Edema/varicosities:  Not grossly evident Abdominal  Soft,nontender, without masses, guarding or rebound.  Liver/spleen:  No organomegaly noted  Hernia:  None appreciated  Skin  Inspection:  Grossly normal Breasts: Examined lying  and sitting.   Right: Without masses, retractions, nipple discharge or axillary adenopathy.   Left: Without masses, retractions, nipple discharge or axillary adenopathy. Genitourinary   Inguinal/mons:  Normal without inguinal adenopathy  External genitalia:  Normal appearing vulva with no masses, tenderness, or lesions  BUS/Urethra/Skene's glands:  Normal without masses or exudate  Vagina:  Normal appearing with normal color and discharge, no lesions  Cervix:  Normal appearing without discharge or lesions  Uterus:  Normal in size, shape and contour.  Mobile, nontender  Adnexa/parametria:     Rt: Normal in size, without masses or tenderness.   Lt: Normal in size, without masses or tenderness.  Anus and perineum: Normal   Raynelle Fanning, CMA present for exam  Assessment/Plan:   1. Well woman exam with routine gynecological exam Pap due 2025  2. Weight gain - Thyroid Panel With TSH - HgB A1c - CBC - Comp Met (CMET)  3. Screening for hypercholesterolemia - Lipid Profile     Discussed SBE, pap and STI screening as directed/appropriate.Continue PNV. Recommend of exercise weekly, including weight bearing exercise. Encouraged the use of seatbelts and sunscreen. Return in 1 year for annual or as needed.   Arlie Solomons B WHNP-BC 8:19 AM 09/04/2023

## 2023-09-05 LAB — THYROID PANEL WITH TSH
Free Thyroxine Index: 2.5 (ref 1.4–3.8)
T3 Uptake: 35 % (ref 22–35)
T4, Total: 7.2 ug/dL (ref 5.1–11.9)
TSH: 1.82 m[IU]/L

## 2023-09-05 LAB — COMPREHENSIVE METABOLIC PANEL
AG Ratio: 1.5 (calc) (ref 1.0–2.5)
ALT: 16 U/L (ref 6–29)
AST: 14 U/L (ref 10–30)
Albumin: 4.2 g/dL (ref 3.6–5.1)
Alkaline phosphatase (APISO): 57 U/L (ref 31–125)
BUN: 18 mg/dL (ref 7–25)
CO2: 27 mmol/L (ref 20–32)
Calcium: 9.1 mg/dL (ref 8.6–10.2)
Chloride: 104 mmol/L (ref 98–110)
Creat: 0.91 mg/dL (ref 0.50–0.97)
Globulin: 2.8 g/dL (ref 1.9–3.7)
Glucose, Bld: 93 mg/dL (ref 65–99)
Potassium: 4.1 mmol/L (ref 3.5–5.3)
Sodium: 137 mmol/L (ref 135–146)
Total Bilirubin: 0.5 mg/dL (ref 0.2–1.2)
Total Protein: 7 g/dL (ref 6.1–8.1)

## 2023-09-05 LAB — LIPID PANEL
Cholesterol: 186 mg/dL (ref <200)
HDL: 81 mg/dL (ref >=50)
LDL Cholesterol (Calc): 89 mg/dL
Non-HDL Cholesterol (Calc): 105 mg/dL (ref <130)
Total CHOL/HDL Ratio: 2.3 (calc) (ref <5.0)
Triglycerides: 71 mg/dL (ref <150)

## 2023-09-05 LAB — CBC
HCT: 43.5 % (ref 35.0–45.0)
Hemoglobin: 14.1 g/dL (ref 11.7–15.5)
MCH: 30.2 pg (ref 27.0–33.0)
MCHC: 32.4 g/dL (ref 32.0–36.0)
MCV: 93.1 fL (ref 80.0–100.0)
MPV: 11.1 fL (ref 7.5–12.5)
Platelets: 208 10*3/uL (ref 140–400)
RBC: 4.67 10*6/uL (ref 3.80–5.10)
RDW: 11.9 % (ref 11.0–15.0)
WBC: 6.2 10*3/uL (ref 3.8–10.8)

## 2023-09-05 LAB — HEMOGLOBIN A1C
Hgb A1c MFr Bld: 5.1 %{Hb} (ref <5.7)
Mean Plasma Glucose: 100 mg/dL
eAG (mmol/L): 5.5 mmol/L

## 2023-12-04 NOTE — L&D Delivery Note (Signed)
 DELIVERY NOTE  Pt complete and at +2 station with urge to push. Epidural controlling pain. Pt pushed and delivered a viable female infant in LOA position. Anterior and posterior shoulders spontaneously delivered with next two pushes; body easily followed next. Infant placed on mothers abdomen and bulb suction of mouth and nose performed. Cord was then clamped and cut by MD at less than one minute as poor respiratory effort and no cry noted. Terminal meconium Transferred to warmer and NICU paged urgently as HR dropped to 70s. At this time, NICU/respiratory arrived. Cord gases, both arterial and venous were obtained. Cord blood obtained, 3VC.  Placenta then delivered at 1518 intact, palpated warm as did infant after delivery. Fundal massage performed and pitocin  per protocol. Fundus firm. The following lacerations were noted: 2nd deg. Repaired in routine fashion with 2-0 vicryl. Mother and baby stable when I left room. Given warmth of placenta and baby, Unasyn  ordered, reviewed with patient and will continue to 24hrs postpartum. Counts correct   Infant time: 1516 Gender: female Placenta time: 1518 Apgars: 4/7 Weight: 2970g

## 2023-12-23 ENCOUNTER — Telehealth: Payer: Self-pay | Admitting: Internal Medicine

## 2024-01-08 NOTE — Telephone Encounter (Signed)
 Error

## 2024-01-09 ENCOUNTER — Ambulatory Visit: Payer: Managed Care, Other (non HMO) | Admitting: Internal Medicine

## 2024-01-09 ENCOUNTER — Encounter: Payer: Self-pay | Admitting: Internal Medicine

## 2024-01-09 VITALS — BP 115/79 | HR 73 | Temp 97.4°F | Resp 18 | Wt 195.2 lb

## 2024-01-09 DIAGNOSIS — F411 Generalized anxiety disorder: Secondary | ICD-10-CM | POA: Diagnosis not present

## 2024-01-09 DIAGNOSIS — F320 Major depressive disorder, single episode, mild: Secondary | ICD-10-CM | POA: Diagnosis not present

## 2024-01-09 DIAGNOSIS — L0291 Cutaneous abscess, unspecified: Secondary | ICD-10-CM

## 2024-01-09 DIAGNOSIS — J4520 Mild intermittent asthma, uncomplicated: Secondary | ICD-10-CM

## 2024-01-09 DIAGNOSIS — M779 Enthesopathy, unspecified: Secondary | ICD-10-CM

## 2024-01-09 MED ORDER — SULFAMETHOXAZOLE-TRIMETHOPRIM 800-160 MG PO TABS
1.0000 | ORAL_TABLET | Freq: Two times a day (BID) | ORAL | 0 refills | Status: AC
Start: 2024-01-09 — End: 2024-01-16

## 2024-01-09 NOTE — Progress Notes (Signed)
 [facility] [address] Christy Alvarado [place] [address] Dept: [phone] Dept Fax: [phone]    Subjective:   Christy Alvarado 08/01/1986 01/09/2024  Chief Complaint  Patient presents with   Medical Managment of Chronic Issues     PT C/O of in grown hair bump near pantie line area that is painful to the touch for 1 day. Prevanr 20 vaccine is due     HPI: [name] presents today for re-assessment and management of chronic medical conditions.  Discussed the use of AI scribe software for clinical note transcription with the patient, who gave verbal consent to proceed.  History of Present Illness   The patient, with a history of anxiety, depression, ADHD, and asthma, presents for a follow-up visit. She reports that her anxiety and depression have improved since transitioning from Wellbutrin  and Prozac  to Zoloft . She has also stopped taking Concerta  for ADHD, which she reports is well-controlled. She is currently trying to conceive and is going to use LH strips to track ovulation.  The patient's asthma is well-controlled with AirSupra  as needed and twice-daily Wixela. She also takes Xyzal  and Singulair  for allergies. She has not had any recent asthma flare-ups.  The patient also reports a new skin lesion near her panty line, which she noticed the day before the visit. The lesion is sore to the touch but has no drainage. She did recently have her bikini area waxed a couple weeks ago.   In addition, the patient has been dealing with hip tendonitis, which has not improved despite physical therapy. The pain and limited mobility from the tendonitis have led to decreased activity and weight gain, which the patient finds frustrating.        The following portions of the patient's history were reviewed and updated as appropriate: past medical history, past surgical history, family history, social history, allergies, medications, and problem  list.   Patient Active Problem List   Diagnosis Date Noted   Pain of left hip 05/28/2023   Generalized anxiety disorder 05/28/2023   Depression, major, single episode, mild (HCC) 05/28/2023   Mild intermittent asthma, uncomplicated 02/15/2022   Seasonal and perennial allergic rhinitis 02/15/2022   Food intolerance 02/15/2022   Iron deficiency anemia 09/01/2012   Exercise-induced asthma 09/01/2012   ADD (attention deficit disorder)    Past Medical History:  Diagnosis Date   ADD (attention deficit disorder)    Anemia    Anxiety    Asthma    grew out of it, childhood   Depression    Fatigue    Fibroid of cervix    Hip pain    left hip pain 2024   Plantar wart of left foot 04/14/2014   Seasonal allergies    Syncope, vasovagal    Past Surgical History:  Procedure Laterality Date   HYSTEROSCOPY WITH D & C N/A 08/12/2018   Procedure: DILATATION AND CURETTAGE /HYSTEROSCOPY;  Surgeon: Jannis Kate Norris, MD;  Location: [facility];  Service: Gynecology;  Laterality: N/A;   NASAL SEPTUM SURGERY     PROLAPSED UTERINE FIBROID LIGATION N/A 08/12/2018   Procedure: MYOMECTOMY CERVICAL;  Surgeon: Jannis Kate Norris, MD;  Location: [facility];  Service: Gynecology;  Laterality: N/A;   RECONSTRUCTION MANDIBLE / MAXILLA     underbite correction   Family History  Problem Relation Age of Onset   Cancer Father        prostate   Stroke Father  Urticaria Maternal Aunt    Heart disease Maternal Grandmother    Hyperlipidemia Maternal Grandmother    Stroke Maternal Grandmother    Hypertension Maternal Grandmother     Current Outpatient Medications:    Albuterol -Budesonide  (AIRSUPRA ) 90-80 MCG/ACT AERO, Inhale 2 puffs into the lungs every 4 (four) hours as needed., Disp: 10.7 g, Rfl: 5   levocetirizine (XYZAL ) 5 MG tablet, Take 1 tablet (5 mg total) by mouth in the morning., Disp: 30 tablet, Rfl: 5   montelukast  (SINGULAIR ) 10 MG tablet, Take 1 tablet (10  mg total) by mouth at bedtime., Disp: 90 tablet, Rfl: 3   Prenatal Vit-Fe Fumarate-FA (PRENATAL PO), Take by mouth., Disp: , Rfl:    sertraline  (ZOLOFT ) 50 MG tablet, Take 1/2 tablet by mouth once daily for 5 days. Then take 1 tablet by mouth daily., Disp: 90 tablet, Rfl: 3   sulfamethoxazole -trimethoprim  (BACTRIM  DS) 800-160 MG tablet, Take 1 tablet by mouth 2 (two) times daily for 7 days., Disp: 14 tablet, Rfl: 0   fluticasone -salmeterol (WIXELA INHUB) 100-50 MCG/ACT AEPB, Inhale 1 puff into the lungs daily., Disp: 60 each, Rfl: 11   meloxicam  (MOBIC ) 15 MG tablet, Take 1 tablet (15 mg total) by mouth daily. (Patient not taking: Reported on 01/09/2024), Disp: , Rfl:  No Known Allergies   ROS: A complete ROS was performed with pertinent positives/negatives noted in the HPI. The remainder of the ROS are negative.    Objective:   Today's Vitals   01/09/24 0934  BP: 115/79  Pulse: 73  Resp: 18  Temp: (!) 97.4 F (36.3 C)  TempSrc: Temporal  SpO2: 98%  Weight: 195 lb 3.2 oz (88.5 kg)  PainSc: 4   PainLoc: Groin    GENERAL: Well-appearing, in NAD. Well nourished.  SKIN: Pink, warm and dry. 1cmx 2cm non-erythematous, non-fluctuant tender abscess to R. groin  NECK: Trachea midline. Full ROM w/o pain or tenderness. No lymphadenopathy.  RESPIRATORY: Chest wall symmetrical. Respirations even and non-labored. Breath sounds clear to auscultation bilaterally.  CARDIAC: S1, S2 present, regular rate and rhythm. Peripheral pulses 2+ bilaterally.  EXTREMITIES: Without clubbing, cyanosis, or edema.  NEUROLOGIC:  Steady, even gait.  PSYCH/MENTAL STATUS: Alert, oriented x 3. Cooperative, appropriate mood and affect.   Health Maintenance Due  Topic Date Due   Pneumococcal Vaccine 63-61 Years old (1 of 2 - PCV) Never done    No results found for any visits on 01/09/24.  The ASCVD Risk score (Arnett DK, et al., 2019) failed to calculate for the following reasons:   The 2019 ASCVD risk score is  only valid for ages 4 to 61     Assessment & Plan:  Assessment and Plan    Anxiety and Depression Stable on Zoloft  after transitioning from Wellbutrin  and Prozac . -Continue Zoloft .  ADHD Stable after discontinuing Concerta . -No changes to current management.  Asthma No recent flare-ups. Currently on Wixela, Xyzal , Singulair , and Air Supra as needed. -Continue current medications.  Skin Abscess New onset, likely related to recent waxing. Tender, no drainage. -Start Bactrim , one tablet twice daily for seven days. -Apply warm compresses to the area.  Tendonitis Ongoing issue with hip, causing limitations in physical activity. -Continue current management as directed by physical therapist.   No orders of the defined types were placed in this encounter.  No images are attached to the encounter or orders placed in the encounter. Meds ordered this encounter  Medications   sulfamethoxazole -trimethoprim  (BACTRIM  DS) 800-160 MG tablet    Sig: Take  1 tablet by mouth 2 (two) times daily for 7 days.    Dispense:  14 tablet    Refill:  0    Supervising Provider:   SEBASTIAN BEVERLEY NOVAK [[id number]]    Return in about 6 months (around 07/08/2024) for Chronic Condition follow up.   Rosina Senters, FNP

## 2024-01-14 LAB — HM PAP SMEAR

## 2024-01-14 LAB — RESULTS CONSOLE HPV: CHL HPV: NEGATIVE

## 2024-02-04 ENCOUNTER — Ambulatory Visit: Payer: Managed Care, Other (non HMO) | Admitting: Radiology

## 2024-02-11 NOTE — Telephone Encounter (Signed)
 Yes, please refer to OB and have her start PNV daily. Thanks

## 2024-02-11 NOTE — Telephone Encounter (Signed)
 Christy Alvarado -please review and advise if ok to establish with OB/GYN.

## 2024-02-13 ENCOUNTER — Ambulatory Visit: Admitting: Radiology

## 2024-02-13 ENCOUNTER — Encounter: Payer: Self-pay | Admitting: Internal Medicine

## 2024-02-13 NOTE — Telephone Encounter (Signed)
 Patient contacted front office for appt for BV symptoms and scheduled for 3/17.   I returned call to patient. Reports some discomfort in the "area where she pees" and a gray/green vaginal discharge with slight odor. Patient states she initially cancelled because she experiences vag discharge regularly and has not worsened. Denies itching, bleeding, fever/chills.   Scheduled with OB on 03/10/24.   OV scheduled with Jami for 3/14 at 1330. Advised I will forward to Jami to review and our office will f/u with any additional recommendations. Patient agreeable.

## 2024-02-14 ENCOUNTER — Encounter: Payer: Self-pay | Admitting: Radiology

## 2024-02-14 ENCOUNTER — Ambulatory Visit: Admitting: Radiology

## 2024-02-14 VITALS — BP 114/68 | HR 83 | Wt 194.6 lb

## 2024-02-14 DIAGNOSIS — N760 Acute vaginitis: Secondary | ICD-10-CM | POA: Diagnosis not present

## 2024-02-14 LAB — WET PREP FOR TRICH, YEAST, CLUE

## 2024-02-14 MED ORDER — TERCONAZOLE 0.4 % VA CREA: 1.0000 | TOPICAL_CREAM | Freq: Every day | VAGINAL | 0 refills | Status: DC

## 2024-02-14 MED ORDER — CLINDAMYCIN HCL 300 MG PO CAPS
300.0000 mg | ORAL_CAPSULE | Freq: Two times a day (BID) | ORAL | 0 refills | Status: AC
Start: 2024-02-14 — End: 2024-02-21

## 2024-02-14 NOTE — Progress Notes (Signed)
      Subjective: Christy Alvarado is a 38 y.o. female who complains of vaginal discharge with odor, burning, no itching. Symptoms began a few weeks ago. (planning pregnancy--had positive home pregnancy test 2 days ago) LMP: 01/14/24. Has appointment with OB doctor 03/10/24.     Review of Systems  All other systems reviewed and are negative.   Past Medical History:  Diagnosis Date   ADD (attention deficit disorder)    Anemia    Anxiety    Asthma    grew out of it, childhood   Depression    Fatigue    Fibroid of cervix    Hip pain    left hip pain 2024   Plantar wart of left foot 04/14/2014   Seasonal allergies    Syncope, vasovagal       Objective:  Today's Vitals   02/14/24 1323  BP: 114/68  Pulse: 83  SpO2: 96%  Weight: 194 lb 9.6 oz (88.3 kg)   Body mass index is 34.47 kg/m.   Physical Exam Vitals and nursing note reviewed. Exam conducted with a chaperone present.  Constitutional:      Appearance: Normal appearance. She is well-developed.  Pulmonary:     Effort: Pulmonary effort is normal.  Abdominal:     General: Abdomen is flat.     Palpations: Abdomen is soft.  Genitourinary:    General: Normal vulva.     Vagina: Vaginal discharge present. No erythema, bleeding or lesions.     Cervix: Normal. No discharge, friability, lesion or erythema.     Uterus: Normal.      Adnexa: Right adnexa normal and left adnexa normal.  Neurological:     Mental Status: She is alert.  Psychiatric:        Mood and Affect: Mood normal.        Thought Content: Thought content normal.        Judgment: Judgment normal.     Microscopic wet-mount exam shows clue cells, hyphae.   Raynelle Fanning, CMA present for exam  Assessment:/Plan:   1. Acute vaginitis (Primary)  - WET PREP FOR TRICH, YEAST, CLUE - clindamycin (CLEOCIN) 300 MG capsule; Take 1 capsule (300 mg total) by mouth in the morning and at bedtime for 7 days.  Dispense: 14 capsule; Refill: 0 - terconazole (TERAZOL  7) 0.4 % vaginal cream; Place 1 applicator vaginally at bedtime.  Dispense: 45 g; Refill: 0  Consulted with Dr Karma Greaser regarding treatment in first trimester  Avoid intercourse until symptoms are resolved. Safe sex encouraged. Avoid the use of soaps or perfumed products in the peri area. Avoid tub baths and sitting in sweaty or wet clothing for prolonged periods of time.     Amman Bartel B, NP 1:36 PM

## 2024-02-17 ENCOUNTER — Ambulatory Visit: Admitting: Nurse Practitioner

## 2024-02-19 ENCOUNTER — Ambulatory Visit: Admitting: Internal Medicine

## 2024-02-24 NOTE — Telephone Encounter (Signed)
 See on 02/14/24 by JC, Tx with Cleocin and Terazol 7.  Scheduled to see OB on 03/10/24  Tiffany -please review and advise.

## 2024-02-24 NOTE — Telephone Encounter (Signed)
 Recommend OV, especially to check UA since she is pregnant.

## 2024-02-25 ENCOUNTER — Ambulatory Visit: Admitting: Radiology

## 2024-02-25 VITALS — BP 114/76 | HR 83 | Wt 197.0 lb

## 2024-02-25 DIAGNOSIS — N898 Other specified noninflammatory disorders of vagina: Secondary | ICD-10-CM

## 2024-02-25 DIAGNOSIS — R3915 Urgency of urination: Secondary | ICD-10-CM | POA: Diagnosis not present

## 2024-02-25 LAB — URINALYSIS, COMPLETE W/RFL CULTURE
Bacteria, UA: NONE SEEN /HPF
Bilirubin Urine: NEGATIVE
Glucose, UA: NEGATIVE
Hgb urine dipstick: NEGATIVE
Hyaline Cast: NONE SEEN /LPF
Ketones, ur: NEGATIVE
Leukocyte Esterase: NEGATIVE
Nitrites, Initial: NEGATIVE
Protein, ur: NEGATIVE
RBC / HPF: NONE SEEN /HPF (ref 0–2)
Specific Gravity, Urine: 1.01 (ref 1.001–1.035)
WBC, UA: NONE SEEN /HPF (ref 0–5)
pH: 5.5 (ref 5.0–8.0)

## 2024-02-25 LAB — NO CULTURE INDICATED

## 2024-02-25 LAB — WET PREP FOR TRICH, YEAST, CLUE

## 2024-02-25 NOTE — Progress Notes (Signed)
      Subjective: Christy Alvarado is a 38 y.o. female who complains of vaginal discharge, itching, urinary urgency. Symptoms persisted after treatment for vaginitis 2 weeks ago. LMP: 01/14/24, [redacted] weeks pregnant. Has OB appointment 03/10/24.     Review of Systems  All other systems reviewed and are negative.   Past Medical History:  Diagnosis Date   ADD (attention deficit disorder)    Anemia    Anxiety    Asthma    grew out of it, childhood   Depression    Fatigue    Fibroid of cervix    Hip pain    left hip pain 2024   Plantar wart of left foot 04/14/2014   Seasonal allergies    Syncope, vasovagal       Objective:  Today's Vitals   02/25/24 0952  BP: 114/76  Pulse: 83  SpO2: 96%  Weight: 197 lb (89.4 kg)   Body mass index is 34.9 kg/m.   Physical Exam Vitals and nursing note reviewed. Exam conducted with a chaperone present.  Constitutional:      Appearance: Normal appearance. She is well-developed.  Pulmonary:     Effort: Pulmonary effort is normal.  Abdominal:     General: Abdomen is flat.     Palpations: Abdomen is soft.  Genitourinary:    General: Normal vulva.     Vagina: Vaginal discharge present. No erythema, bleeding or lesions.     Cervix: Normal. No discharge, friability, lesion or erythema.     Uterus: Normal.      Adnexa: Right adnexa normal and left adnexa normal.  Neurological:     Mental Status: She is alert.  Psychiatric:        Mood and Affect: Mood normal.        Thought Content: Thought content normal.        Judgment: Judgment normal.     Urine dipstick shows negative for all components.  Micro exam: negative for WBC's or RBC's.  Microscopic wet-mount exam shows negative for pathogens, normal epithelial cells.   Raynelle Fanning, CMA present for exam  Assessment:/Plan:  1. Urinary urgency (Primary) Reassured negative ua - Urinalysis,Complete w/RFL Culture  2. Vaginal discharge Reassured negative wet prep - WET PREP FOR TRICH,  YEAST, CLUE   Marvelle Caudill B, NP 10:06 AM

## 2024-03-10 LAB — OB RESULTS CONSOLE GC/CHLAMYDIA
Chlamydia: NEGATIVE
Neisseria Gonorrhea: NEGATIVE

## 2024-04-07 LAB — OB RESULTS CONSOLE ABO/RH: RH Type: POSITIVE

## 2024-04-07 LAB — OB RESULTS CONSOLE ANTIBODY SCREEN: Antibody Screen: NEGATIVE

## 2024-04-07 LAB — OB RESULTS CONSOLE RUBELLA ANTIBODY, IGM: Rubella: NON-IMMUNE/NOT IMMUNE

## 2024-04-07 LAB — OB RESULTS CONSOLE HIV ANTIBODY (ROUTINE TESTING): HIV: NONREACTIVE

## 2024-04-07 LAB — OB RESULTS CONSOLE RPR: RPR: NONREACTIVE

## 2024-04-07 LAB — HEPATITIS C ANTIBODY: HCV Ab: NEGATIVE

## 2024-04-07 LAB — OB RESULTS CONSOLE HEPATITIS B SURFACE ANTIGEN: Hepatitis B Surface Ag: NEGATIVE

## 2024-05-06 ENCOUNTER — Encounter: Payer: Self-pay | Admitting: Internal Medicine

## 2024-07-03 ENCOUNTER — Encounter: Payer: Self-pay | Admitting: Internal Medicine

## 2024-07-08 ENCOUNTER — Ambulatory Visit: Payer: Managed Care, Other (non HMO) | Admitting: Internal Medicine

## 2024-07-12 ENCOUNTER — Other Ambulatory Visit: Payer: Self-pay | Admitting: Internal Medicine

## 2024-07-12 DIAGNOSIS — F411 Generalized anxiety disorder: Secondary | ICD-10-CM

## 2024-07-12 DIAGNOSIS — F320 Major depressive disorder, single episode, mild: Secondary | ICD-10-CM

## 2024-07-13 ENCOUNTER — Other Ambulatory Visit: Payer: Self-pay | Admitting: Allergy & Immunology

## 2024-08-17 ENCOUNTER — Encounter: Payer: Self-pay | Admitting: Internal Medicine

## 2024-09-24 LAB — OB RESULTS CONSOLE GBS: GBS: NEGATIVE

## 2024-09-25 ENCOUNTER — Encounter: Payer: Self-pay | Admitting: Internal Medicine

## 2024-10-14 ENCOUNTER — Telehealth (HOSPITAL_COMMUNITY): Payer: Self-pay | Admitting: *Deleted

## 2024-10-14 ENCOUNTER — Encounter (HOSPITAL_COMMUNITY): Payer: Self-pay | Admitting: *Deleted

## 2024-10-14 NOTE — Telephone Encounter (Signed)
 Preadmission screen

## 2024-10-26 ENCOUNTER — Encounter (HOSPITAL_COMMUNITY): Payer: Self-pay

## 2024-10-26 ENCOUNTER — Other Ambulatory Visit: Payer: Self-pay

## 2024-10-26 ENCOUNTER — Inpatient Hospital Stay (HOSPITAL_COMMUNITY)
Admission: RE | Admit: 2024-10-26 | Discharge: 2024-10-29 | DRG: 805 | Disposition: A | Discharge status: Home / Self Care | Attending: Obstetrics and Gynecology | Admitting: Obstetrics and Gynecology

## 2024-10-26 DIAGNOSIS — J45909 Unspecified asthma, uncomplicated: Secondary | ICD-10-CM | POA: Diagnosis present

## 2024-10-26 DIAGNOSIS — Z8249 Family history of ischemic heart disease and other diseases of the circulatory system: Secondary | ICD-10-CM | POA: Diagnosis not present

## 2024-10-26 DIAGNOSIS — O9902 Anemia complicating childbirth: Secondary | ICD-10-CM | POA: Diagnosis present

## 2024-10-26 DIAGNOSIS — O9952 Diseases of the respiratory system complicating childbirth: Secondary | ICD-10-CM | POA: Diagnosis present

## 2024-10-26 DIAGNOSIS — Z3A40 40 weeks gestation of pregnancy: Secondary | ICD-10-CM

## 2024-10-26 DIAGNOSIS — F419 Anxiety disorder, unspecified: Secondary | ICD-10-CM | POA: Diagnosis present

## 2024-10-26 DIAGNOSIS — O99344 Other mental disorders complicating childbirth: Secondary | ICD-10-CM | POA: Diagnosis present

## 2024-10-26 DIAGNOSIS — O480 Post-term pregnancy: Principal | ICD-10-CM | POA: Diagnosis present

## 2024-10-26 DIAGNOSIS — F32A Depression, unspecified: Secondary | ICD-10-CM | POA: Diagnosis present

## 2024-10-26 DIAGNOSIS — O411230 Chorioamnionitis, third trimester, not applicable or unspecified: Secondary | ICD-10-CM | POA: Diagnosis present

## 2024-10-26 DIAGNOSIS — Z3490 Encounter for supervision of normal pregnancy, unspecified, unspecified trimester: Principal | ICD-10-CM

## 2024-10-26 LAB — TYPE AND SCREEN
ABO/RH(D): O POS
Antibody Screen: NEGATIVE

## 2024-10-26 LAB — CBC
HCT: 41.5 % (ref 36.0–46.0)
Hemoglobin: 14.1 g/dL (ref 12.0–15.0)
MCH: 28.3 pg (ref 26.0–34.0)
MCHC: 34 g/dL (ref 30.0–36.0)
MCV: 83.2 fL (ref 80.0–100.0)
Platelets: 188 K/uL (ref 150–400)
RBC: 4.99 MIL/uL (ref 3.87–5.11)
RDW: 13.1 % (ref 11.5–15.5)
WBC: 13.4 K/uL — ABNORMAL HIGH (ref 4.0–10.5)
nRBC: 0 % (ref 0.0–0.2)

## 2024-10-26 LAB — COMPREHENSIVE METABOLIC PANEL WITH GFR
ALT: 25 U/L (ref 0–44)
AST: 24 U/L (ref 15–41)
Albumin: 2.9 g/dL — ABNORMAL LOW (ref 3.5–5.0)
Alkaline Phosphatase: 233 U/L — ABNORMAL HIGH (ref 38–126)
Anion gap: 12 (ref 5–15)
BUN: 10 mg/dL (ref 6–20)
CO2: 18 mmol/L — ABNORMAL LOW (ref 22–32)
Calcium: 9.3 mg/dL (ref 8.9–10.3)
Chloride: 105 mmol/L (ref 98–111)
Creatinine, Ser: 0.67 mg/dL (ref 0.44–1.00)
GFR, Estimated: >60 mL/min (ref >60)
Glucose, Bld: 95 mg/dL (ref 70–99)
Potassium: 3.5 mmol/L (ref 3.5–5.1)
Sodium: 135 mmol/L (ref 135–145)
Total Bilirubin: 0.4 mg/dL (ref 0.0–1.2)
Total Protein: 6.7 g/dL (ref 6.5–8.1)

## 2024-10-26 MED ORDER — EPHEDRINE 5 MG/ML INJ
10.0000 mg | INTRAVENOUS | Status: DC | PRN
Start: 2024-10-26 — End: 2024-10-27

## 2024-10-26 MED ORDER — TERBUTALINE SULFATE 1 MG/ML IJ SOLN: 0.2500 mg | Freq: Once | INTRAMUSCULAR | Status: DC | PRN

## 2024-10-26 MED ORDER — DIPHENHYDRAMINE HCL 50 MG/ML IJ SOLN: 12.5000 mg | INTRAMUSCULAR | Status: DC | PRN

## 2024-10-26 MED ORDER — PHENYLEPHRINE 80 MCG/ML (10ML) SYRINGE FOR IV PUSH (FOR BLOOD PRESSURE SUPPORT)
80.0000 ug | PREFILLED_SYRINGE | INTRAVENOUS | Status: DC | PRN
Start: 2024-10-26 — End: 2024-10-27

## 2024-10-26 MED ORDER — OXYTOCIN-SODIUM CHLORIDE 30-0.9 UT/500ML-% IV SOLN
1.0000 m[IU]/min | INTRAVENOUS | Status: DC
  Administered 2024-10-26: 2 m[IU]/min via INTRAVENOUS
  Filled 2024-10-26: qty 500

## 2024-10-26 MED ORDER — FENTANYL-BUPIVACAINE-NACL 0.5-0.125-0.9 MG/250ML-% EP SOLN
12.0000 mL/h | EPIDURAL | Status: DC | PRN
  Administered 2024-10-27: 12 mL/h via EPIDURAL
  Filled 2024-10-26: qty 250

## 2024-10-26 MED ORDER — ACETAMINOPHEN 325 MG PO TABS
650.0000 mg | ORAL_TABLET | ORAL | Status: DC | PRN
Start: 2024-10-26 — End: 2024-10-27

## 2024-10-26 MED ORDER — LACTATED RINGERS IV SOLN
500.0000 mL | Freq: Once | INTRAVENOUS | Status: AC
  Administered 2024-10-27: 500 mL via INTRAVENOUS

## 2024-10-26 MED ORDER — FENTANYL CITRATE (PF) 100 MCG/2ML IJ SOLN
50.0000 ug | INTRAMUSCULAR | Status: DC | PRN
  Administered 2024-10-26 – 2024-10-27 (×3): 100 ug via INTRAVENOUS
  Filled 2024-10-26 (×3): qty 2

## 2024-10-26 MED ORDER — ONDANSETRON HCL 4 MG/2ML IJ SOLN
4.0000 mg | Freq: Four times a day (QID) | INTRAMUSCULAR | Status: DC | PRN
  Administered 2024-10-27: 4 mg via INTRAVENOUS
  Filled 2024-10-26: qty 2

## 2024-10-26 MED ORDER — LACTATED RINGERS IV SOLN: INTRAVENOUS | Status: DC

## 2024-10-26 MED ORDER — HYDROXYZINE HCL 50 MG PO TABS: 50.0000 mg | ORAL_TABLET | Freq: Four times a day (QID) | ORAL | Status: DC | PRN

## 2024-10-26 MED ORDER — PHENYLEPHRINE 80 MCG/ML (10ML) SYRINGE FOR IV PUSH (FOR BLOOD PRESSURE SUPPORT)
80.0000 ug | PREFILLED_SYRINGE | INTRAVENOUS | Status: DC | PRN
  Filled 2024-10-26: qty 10

## 2024-10-26 MED ORDER — LACTATED RINGERS IV SOLN: 500.0000 mL | INTRAVENOUS | Status: DC | PRN

## 2024-10-26 MED ORDER — OXYTOCIN BOLUS FROM INFUSION
333.0000 mL | Freq: Once | INTRAVENOUS | Status: AC
  Administered 2024-10-27: 333 mL via INTRAVENOUS

## 2024-10-26 MED ORDER — LIDOCAINE HCL (PF) 1 % IJ SOLN: 30.0000 mL | INTRAMUSCULAR | Status: DC | PRN

## 2024-10-26 MED ORDER — OXYTOCIN-SODIUM CHLORIDE 30-0.9 UT/500ML-% IV SOLN: 2.5000 [IU]/h | INTRAVENOUS | Status: DC

## 2024-10-26 MED ORDER — MISOPROSTOL 25 MCG QUARTER TABLET
25.0000 ug | ORAL_TABLET | ORAL | Status: DC
  Administered 2024-10-26: 25 ug via VAGINAL
  Filled 2024-10-26: qty 1

## 2024-10-26 MED ORDER — SOD CITRATE-CITRIC ACID 500-334 MG/5ML PO SOLN
30.0000 mL | ORAL | Status: DC | PRN
Start: 2024-10-26 — End: 2024-10-27
  Administered 2024-10-27: 30 mL via ORAL
  Filled 2024-10-26: qty 30

## 2024-10-26 NOTE — H&P (Signed)
 Christy Alvarado is a 38 y.o. G1P0 at [redacted]w[redacted]d presenting for IOL for post-dates.  Patient notes she is doing well. Denis VB, LOF or ctx. Reports good fetal movement.  OB History     Gravida  1   Para  0   Term  0   Preterm  0   AB  0   Living  0      SAB  0   IAB  0   Ectopic  0   Multiple  0   Live Births             Past Medical History:  Diagnosis Date   ADD (attention deficit disorder)    Anemia    Anxiety    Asthma    grew out of it, childhood   Depression    Fatigue    Fibroid of cervix    Hip pain    left hip pain 2024   Plantar wart of left foot 04/14/2014   Seasonal allergies    Syncope, vasovagal    Past Surgical History:  Procedure Laterality Date   HYSTEROSCOPY WITH D & C N/A 08/12/2018   Procedure: DILATATION AND CURETTAGE /HYSTEROSCOPY;  Surgeon: Christy Kate Norris, MD;  Location: [facility];  Service: Gynecology;  Laterality: N/A;   NASAL SEPTUM SURGERY     PROLAPSED UTERINE FIBROID LIGATION N/A 08/12/2018   Procedure: MYOMECTOMY CERVICAL;  Surgeon: Christy Kate Norris, MD;  Location: [facility];  Service: Gynecology;  Laterality: N/A;   RECONSTRUCTION MANDIBLE / MAXILLA     underbite correction   Family History: family history includes Cancer in her father; Heart disease in her maternal grandmother; Hyperlipidemia in her maternal grandmother; Hypertension in her maternal grandmother; Stroke in her father and maternal grandmother; Urticaria in her maternal aunt. Social History:  reports that she has never smoked. She has never been exposed to tobacco smoke. She has never used smokeless tobacco. She reports current alcohol use of about 5.0 standard drinks of alcohol per week. She reports that she does not use drugs.     Maternal Diabetes: No Genetic Screening: Normal Maternal Ultrasounds/Referrals: Normal Fetal Ultrasounds or other Referrals:  None Maternal Substance Abuse:  No Significant Maternal  Medications:  None Significant Maternal Lab Results:  None Number of Prenatal Visits:greater than 3 verified prenatal visits Maternal Vaccinations:RSV: Given during pregnancy >/=14 days ago, TDap, Flu, and Covid Other Comments:  None  Review of Systems  Constitutional:  Negative for chills and fever.  Respiratory:  Negative for cough, chest tightness and shortness of breath.   Gastrointestinal:  Negative for abdominal pain, diarrhea, nausea and vomiting.  Genitourinary:  Negative for dysuria and hematuria.  Skin:  Negative for rash.  Neurological:  Negative for syncope, weakness, numbness and headaches.  Psychiatric/Behavioral:  Negative for behavioral problems.    Dilation: 1 Effacement (%): 50 Station: -3 Exam by:: Christy Rosin, MD Blood pressure (!) 138/91, pulse 94, temperature 97.9 F (36.6 C), temperature source Oral, resp. rate 18, height 5' 3 (1.6 m), weight 97.1 kg, last menstrual period 01/14/2024, SpO2 100%.  Exam Physical Exam Constitutional:      Appearance: Normal appearance.  HENT:     Head: Normocephalic.     Mouth/Throat:     Mouth: Mucous membranes are moist.  Eyes:     Pupils: Pupils are equal, round, and reactive to light.  Cardiovascular:     Rate and Rhythm: Normal rate.  Pulmonary:     Effort:  Pulmonary effort is normal.  Abdominal:     Comments: Gravid, non-tender  Musculoskeletal:     Cervical back: Normal range of motion.  Skin:    General: Skin is warm.  Neurological:     General: No focal deficit present.     Mental Status: She is alert and oriented to person, place, and time.  Psychiatric:        Mood and Affect: Mood normal.        Behavior: Behavior normal.     Prenatal labs: ABO, Rh: --/--/PENDING (11/24 1804) Antibody: PENDING (11/24 1804) Rubella: Nonimmune (05/06 0000) RPR: Nonreactive (05/06 0000)  HBsAg: Negative (05/06 0000)  HIV: Non-reactive (05/06 0000)  GBS: Negative/-- (10/23 0000)   Assessment/Plan: G1P0  at [redacted]w[redacted]d admitted for IOL for post-dates.   Pregnancy complicated by: Elevated BP on arrival - likely in setting of anxiety, PIH labs sent and will continue to monitor AMA - LR NIPT and negative CS Asthma - Singulair  daily  RNI - offer vaccination post-partum Anxiety/Depression - on zoloft  50 mg daily   Labor - s/p cook (60/60) and cytotec  placement  - IV pain medication until 6 cm, or epidural PRN - Pitocin  and AROM PRN  GBS negative/ EFW - 50% on 08/12/24/ Girl undecided on name     Christy Alvarado 10/26/2024, 6:56 PM

## 2024-10-27 ENCOUNTER — Encounter (HOSPITAL_COMMUNITY): Payer: Self-pay

## 2024-10-27 ENCOUNTER — Inpatient Hospital Stay (HOSPITAL_COMMUNITY): Admitting: Anesthesiology

## 2024-10-27 ENCOUNTER — Inpatient Hospital Stay (HOSPITAL_COMMUNITY): Admission: RE | Admit: 2024-10-27 | Source: Ambulatory Visit

## 2024-10-27 LAB — SYPHILIS: RPR W/REFLEX TO RPR TITER AND TREPONEMAL ANTIBODIES, TRADITIONAL SCREENING AND DIAGNOSIS ALGORITHM: RPR Ser Ql: NONREACTIVE

## 2024-10-27 MED ORDER — EPHEDRINE 5 MG/ML INJ: 10.0000 mg | INTRAVENOUS | Status: DC | PRN

## 2024-10-27 MED ORDER — FENTANYL-BUPIVACAINE-NACL 0.5-0.125-0.9 MG/250ML-% EP SOLN: 12.0000 mL/h | EPIDURAL | Status: DC | PRN

## 2024-10-27 MED ORDER — WITCH HAZEL-GLYCERIN EX PADS
1.0000 | MEDICATED_PAD | CUTANEOUS | Status: DC | PRN
Start: 2024-10-27 — End: 2024-10-29

## 2024-10-27 MED ORDER — SERTRALINE HCL 50 MG PO TABS
50.0000 mg | ORAL_TABLET | Freq: Every day | ORAL | Status: DC
  Administered 2024-10-28 (×2): 50 mg via ORAL
  Filled 2024-10-27 (×2): qty 1

## 2024-10-27 MED ORDER — BENZOCAINE-MENTHOL 20-0.5 % EX AERO
1.0000 | INHALATION_SPRAY | CUTANEOUS | Status: DC | PRN
Start: 2024-10-27 — End: 2024-10-29
  Filled 2024-10-27: qty 56

## 2024-10-27 MED ORDER — LIDOCAINE HCL (PF) 1 % IJ SOLN
INTRAMUSCULAR | Status: DC | PRN
  Administered 2024-10-27: 8 mL via EPIDURAL

## 2024-10-27 MED ORDER — SODIUM CHLORIDE 0.9 % IV SOLN
3.0000 g | Freq: Once | INTRAVENOUS | Status: AC
  Administered 2024-10-27: 3 g via INTRAVENOUS
  Filled 2024-10-27: qty 8

## 2024-10-27 MED ORDER — PRENATAL MULTIVITAMIN CH
1.0000 | ORAL_TABLET | Freq: Every day | ORAL | Status: DC
  Administered 2024-10-28 – 2024-10-29 (×2): 1 via ORAL
  Filled 2024-10-27 (×2): qty 1

## 2024-10-27 MED ORDER — COCONUT OIL OIL: 1.0000 | TOPICAL_OIL | Status: DC | PRN

## 2024-10-27 MED ORDER — SIMETHICONE 80 MG PO CHEW
80.0000 mg | CHEWABLE_TABLET | ORAL | Status: DC | PRN
Start: 2024-10-27 — End: 2024-10-29

## 2024-10-27 MED ORDER — DIPHENHYDRAMINE HCL 50 MG/ML IJ SOLN: 12.5000 mg | INTRAMUSCULAR | Status: DC | PRN

## 2024-10-27 MED ORDER — IBUPROFEN 600 MG PO TABS
600.0000 mg | ORAL_TABLET | Freq: Four times a day (QID) | ORAL | Status: DC
  Administered 2024-10-27 – 2024-10-29 (×7): 600 mg via ORAL
  Filled 2024-10-27 (×7): qty 1

## 2024-10-27 MED ORDER — PHENYLEPHRINE 80 MCG/ML (10ML) SYRINGE FOR IV PUSH (FOR BLOOD PRESSURE SUPPORT): 80.0000 ug | PREFILLED_SYRINGE | INTRAVENOUS | Status: DC | PRN

## 2024-10-27 MED ORDER — ACETAMINOPHEN 325 MG PO TABS: 650.0000 mg | ORAL_TABLET | ORAL | Status: DC | PRN

## 2024-10-27 MED ORDER — SENNOSIDES-DOCUSATE SODIUM 8.6-50 MG PO TABS
2.0000 | ORAL_TABLET | Freq: Every day | ORAL | Status: DC
  Administered 2024-10-28 – 2024-10-29 (×2): 2 via ORAL
  Filled 2024-10-27 (×2): qty 2

## 2024-10-27 MED ORDER — ONDANSETRON HCL 4 MG PO TABS: 4.0000 mg | ORAL_TABLET | ORAL | Status: DC | PRN

## 2024-10-27 MED ORDER — TETANUS-DIPHTH-ACELL PERTUSSIS 5-2-15.5 LF-MCG/0.5 IM SUSP
0.5000 mL | Freq: Once | INTRAMUSCULAR | Status: DC
  Filled 2024-10-27: qty 0.5

## 2024-10-27 MED ORDER — LACTATED RINGERS IV SOLN: 500.0000 mL | Freq: Once | INTRAVENOUS | Status: DC

## 2024-10-27 MED ORDER — DIPHENHYDRAMINE HCL 25 MG PO CAPS: 25.0000 mg | ORAL_CAPSULE | Freq: Four times a day (QID) | ORAL | Status: DC | PRN

## 2024-10-27 MED ORDER — ZOLPIDEM TARTRATE 5 MG PO TABS
5.0000 mg | ORAL_TABLET | Freq: Every evening | ORAL | Status: DC | PRN
Start: 2024-10-27 — End: 2024-10-29

## 2024-10-27 MED ORDER — ONDANSETRON HCL 4 MG/2ML IJ SOLN: 4.0000 mg | INTRAMUSCULAR | Status: DC | PRN

## 2024-10-27 MED ORDER — DIBUCAINE (PERIANAL) 1 % EX OINT: 1.0000 | TOPICAL_OINTMENT | CUTANEOUS | Status: DC | PRN

## 2024-10-27 NOTE — Progress Notes (Signed)
 Christy Alvarado is a 38 y.o. G1P0000 at [redacted]w[redacted]d admitted for induction of labor due to Post dates.   Subjective: Pain is well controlled with epidural. Denies LOF. Minimal VB. + FM.   Objective: BP 131/69   Pulse 80   Temp 98.3 F (36.8 C) (Oral)   Resp 18   Ht 5' 3 (1.6 m)   Wt 97.1 kg   LMP 01/14/2024 (Exact Date)   SpO2 99%   BMI 37.91 kg/m  No intake/output data recorded. Total I/O In: -  Out: 650 [Urine:650]  FHT:  FHR: 140 bpm, variability: moderate,  accelerations:  Present,  decelerations:  Present early UC:   q2 min, pitocin  at 15mU/min, MVUs adequate at 200  SVE:  Dilation: 10 Effacement (%): 100 Station: Plus 1 Exam by:: Deel RN   Labs: Lab Results  Component Value Date   WBC 13.4 (H) 10/26/2024   HGB 14.1 10/26/2024   HCT 41.5 10/26/2024   MCV 83.2 10/26/2024   PLT 188 10/26/2024    Assessment / Plan: Induction of labor due to postterm,  progressing well   Labor: s/p cytotec  x 1 & cooks, pitocin  started @ 23:13, s/p AROM @ 0600. Now transitioned to active labor and beginning to push Fetal Wellbeing:  Category I Pain Control:  s/p epidural   [name], MD 10/27/2024, 2:26 PM

## 2024-10-27 NOTE — Progress Notes (Signed)
 Christy Alvarado is a 38 y.o. G1P0000 at 101w0d admitted for induction of labor due to Post dates.   Subjective: Pain is well controlled with epidural. Denies LOF. Minimal VB. + FM. Resting in bed at this time  Objective: BP (!) 105/32   Pulse (!) 124   Temp 98 F (36.7 C) (Oral)   Resp 18   Ht 5' 3 (1.6 m)   Wt 97.1 kg   LMP 01/14/2024 (Exact Date)   SpO2 99%   BMI 37.91 kg/m  No intake/output data recorded. No intake/output data recorded.  FHT:  FHR: 130 bpm, variability: moderate,  accelerations:  Present,  decelerations:  Absent UC:   q3 min  SVE: deferred at this time, prior exam was  Dilation: 4 Effacement (%): 50 Station: -3 Exam by:: Vandermeersh MD  Labs: Lab Results  Component Value Date   WBC 13.4 (H) 10/26/2024   HGB 14.1 10/26/2024   HCT 41.5 10/26/2024   MCV 83.2 10/26/2024   PLT 188 10/26/2024    Assessment / Plan: Induction of labor due to postterm,  progressing well   Labor: s/p cytotec  x 1 & cooks, pitocin  started @ 23:13, s/p AROM @ 0600 Fetal Wellbeing:  Category I Pain Control:  s/p epidural   Christy [name], MD 10/27/2024, 7:54 AM

## 2024-10-27 NOTE — Progress Notes (Signed)
 Christy Alvarado is a 38 y.o. G1P0000 at [redacted]w[redacted]d admitted for induction of labor due to Post dates.   Subjective: Patient reports pain is better controlled after IV pain medication. Denies LOF or VB. + FM  Objective: BP 126/89   Pulse 78   Temp 98.1 F (36.7 C) (Oral)   Resp 18   Ht 5' 3 (1.6 m)   Wt 97.1 kg   LMP 01/14/2024 (Exact Date)   SpO2 100%   BMI 37.91 kg/m  No intake/output data recorded. No intake/output data recorded.  FHT:  FHR: 120 bpm, variability: moderate,  accelerations:  Present,  decelerations:  Absent UC:   q2-4 min SVE:   Dilation: 1 Effacement (%): 50 Station: -3 Exam by:: Christy Rosin, MD  Labs: Lab Results  Component Value Date   WBC 13.4 (H) 10/26/2024   HGB 14.1 10/26/2024   HCT 41.5 10/26/2024   MCV 83.2 10/26/2024   PLT 188 10/26/2024    Assessment / Plan: Induction of labor due to postterm,  progressing well - cooks still in place   Labor: s/p cytotec  x 1, cooks placed at 18:30 - checked around and is 3-4  cm, pitocin  started @ 23:13 Fetal Wellbeing:  Category I Pain Control:  IV pain meds, epidural PRN AROM PRN once cooks is out    Christy CHRISTELLA Oz, MD 10/27/2024, 12:36 AM

## 2024-10-27 NOTE — Anesthesia Procedure Notes (Signed)
 Epidural Patient location during procedure: OB Start time: 10/27/2024 3:36 AM End time: 10/27/2024 3:44 AM  Staffing Anesthesiologist: Mallory Manus, MD  Preanesthetic Checklist Completed: patient identified, IV checked, site marked, risks and benefits discussed, surgical consent, monitors and equipment checked, pre-op evaluation and timeout performed  Epidural Patient position: sitting Prep: DuraPrep and site prepped and draped Patient monitoring: continuous pulse ox and blood pressure Approach: midline Location: L4-L5 Injection technique: LOR air  Needle:  Needle type: Tuohy  Needle gauge: 17 G Needle length: 9 cm and 9 Needle insertion depth: 7 cm Catheter type: closed end flexible Catheter size: 19 Gauge Catheter at skin depth: 13 cm Test dose: negative  Assessment Events: blood not aspirated, no cerebrospinal fluid, injection not painful, no injection resistance, no paresthesia and negative IV test

## 2024-10-27 NOTE — Progress Notes (Signed)
 Christy Alvarado is a 38 y.o. G1P0000 at [redacted]w[redacted]d admitted for induction of labor due to Post dates.   Subjective: Pain is well controlled with epidural. Denies LOF. Minimal VB. + FM.   Objective: BP 125/76   Pulse 78   Temp (!) 97.5 F (36.4 C) (Oral)   Resp 18   Ht 5' 3 (1.6 m)   Wt 97.1 kg   LMP 01/14/2024 (Exact Date)   SpO2 99%   BMI 37.91 kg/m  No intake/output data recorded. Total I/O In: -  Out: 650 [Urine:650]  FHT:  FHR: 135 bpm, variability: moderate,  accelerations:  Present,  decelerations:  Present early UC:   q3 min  SVE:  Dilation: 4 Effacement (%): 90 Station: -2 Exam by:: Christy Bhargava MD IUPC placed  Labs: Lab Results  Component Value Date   WBC 13.4 (H) 10/26/2024   HGB 14.1 10/26/2024   HCT 41.5 10/26/2024   MCV 83.2 10/26/2024   PLT 188 10/26/2024    Assessment / Plan: Induction of labor due to postterm,  progressing well   Labor: s/p cytotec  x 1 & cooks, pitocin  started @ 23:13, s/p AROM @ 0600 Fetal Wellbeing:  Category I Pain Control:  s/p epidural   Christy Alvarado Christy Guppy, MD 10/27/2024, 10:18 AM

## 2024-10-27 NOTE — Anesthesia Preprocedure Evaluation (Addendum)
 Anesthesia Evaluation  Patient identified by MRN, date of birth, ID band Patient awake    Reviewed: Allergy  & Precautions, H&P , NPO status , Patient's Chart, lab work & pertinent test results, reviewed documented beta blocker date and time   Airway Mallampati: III  TM Distance: >3 FB Neck ROM: full    Dental no notable dental hx. (+) Teeth Intact, Dental Advisory Given   Pulmonary asthma    Pulmonary exam normal breath sounds clear to auscultation       Cardiovascular negative cardio ROS Normal cardiovascular exam Rhythm:regular Rate:Normal     Neuro/Psych  PSYCHIATRIC DISORDERS Anxiety Depression     Neuromuscular disease    GI/Hepatic negative GI ROS, Neg liver ROS,,,  Endo/Other  negative endocrine ROS    Renal/GU negative Renal ROS  negative genitourinary   Musculoskeletal   Abdominal   Peds  Hematology  (+) Blood dyscrasia, anemia   Anesthesia Other Findings   Reproductive/Obstetrics (+) Pregnancy                              Anesthesia Physical Anesthesia Plan  ASA: 2  Anesthesia Plan: Epidural   Post-op Pain Management: Minimal or no pain anticipated   Induction: Intravenous  PONV Risk Score and Plan: 2  Airway Management Planned: Natural Airway and Simple Face Mask  Additional Equipment: Fetal Monitoring  Intra-op Plan:   Post-operative Plan:   Informed Consent: I have reviewed the patients History and Physical, chart, labs and discussed the procedure including the risks, benefits and alternatives for the proposed anesthesia with the patient or authorized representative who has indicated his/her understanding and acceptance.     Dental Advisory Given  Plan Discussed with: Anesthesiologist  Anesthesia Plan Comments: (Labs checked- platelets confirmed with RN in room. Fetal heart tracing, per RN, reported to be stable enough for sitting procedure. Discussed  epidural, and patient consents to the procedure:  included risk of possible headache,backache, failed block, allergic reaction, and nerve injury. This patient was asked if she had any questions or concerns before the procedure started.)         Anesthesia Quick Evaluation

## 2024-10-27 NOTE — Lactation Note (Signed)
 This note was copied from a baby's chart. Lactation Consultation Note  Patient Name: Christy Alvarado Unijb'd Date: 10/27/2024 Age:36 hours Reason for consult: Initial assessment;Term;Primapara  P1. RN stated mom is having trouble latching and RN couldn't get baby to latch either and stated the baby is crying. LC went to rm. Baby was fussing as mom held her. LC calmed down the baby. Covered her up. Got mom to put hat on her. Placed baby in football hold to very short shaft nipple. Breast is compressible and LC was able to sand which breast into baby's mouth. LC held the breast there until baby BF well and was maining latch. Got mom to feel breast to check for transfer. Noted significant difference in breast after BF for 20 min. Mom was please.  Mom is over whelmed at how hard BF is for her. Mom kept saying it is really hard. Mom looks upset and exhausted w/BF. Praised mom for a good latch and feeding right now. Newborn feeding habits, STS, I&O, positioning, body alignment, props reviewed. Mom encouraged to feed baby 8-12 times/24 hours and with feeding cues.  LC will go back for next feeding to aide mom in her latching. Encouraged to call for next feeding. Maternal Data Does the patient have breastfeeding experience prior to this delivery?: No  Feeding    LATCH Score Latch: Grasps breast easily, tongue down, lips flanged, rhythmical sucking.  Audible Swallowing: A few with stimulation  Type of Nipple: Everted at rest and after stimulation (very very short shaft)  Comfort (Breast/Nipple): Soft / non-tender  Hold (Positioning): Assistance needed to correctly position infant at breast and maintain latch.  LATCH Score: 8   Lactation Tools Discussed/Used Tools: Shells;Pump;Flanges Flange Size: 18 Breast pump type: Manual Pump Education: Setup, frequency, and cleaning;Milk Storage Reason for Pumping: pre-pump Pumping frequency: pre-pump  Interventions Interventions: Breast  feeding basics reviewed;Assisted with latch;Skin to skin;Breast massage;Hand express;Pre-pump if needed;Breast compression;Adjust position;Support pillows;Position options;Hand pump;Shells;Education;LC Services brochure  Discharge    Consult Status Consult Status: Follow-up Date: 10/28/24 Follow-up type: In-patient    Javaughn Opdahl G 10/27/2024, 11:12 PM

## 2024-10-27 NOTE — Progress Notes (Signed)
 Chaplain responded to neonatal code blue. Infant stabilized.  Please page as further needs arise.  Alan HERO. Davee Lomax, M.Div. [facility] Chaplain Pager [phone] Office [phone]

## 2024-10-27 NOTE — Progress Notes (Signed)
 Christy Alvarado is a 38 y.o. G1P0000 at [redacted]w[redacted]d admitted for induction of labor due to Post dates.   Subjective: Pain is well controlled with epidural. Denies LOF. Minimal VB. + FM  Objective: BP 115/68   Pulse 67   Temp 98.1 F (36.7 C) (Oral)   Resp 18   Ht 5' 3 (1.6 m)   Wt 97.1 kg   LMP 01/14/2024 (Exact Date)   SpO2 99%   BMI 37.91 kg/m  No intake/output data recorded. No intake/output data recorded.  FHT:  FHR: 135 bpm, variability: moderate,  accelerations:  Present,  decelerations:  Absent UC:   q3 min  SVE:   Dilation: 4 Effacement (%): 50 Station: -3 Exam by:: Vandermeersh MD  Labs: Lab Results  Component Value Date   WBC 13.4 (H) 10/26/2024   HGB 14.1 10/26/2024   HCT 41.5 10/26/2024   MCV 83.2 10/26/2024   PLT 188 10/26/2024    Assessment / Plan: Induction of labor due to postterm,  progressing well   R/B of AROM discussed, patient is amenable  Labor: s/p cytotec  x 1 & cooks, pitocin  started @ 23:13, AROM @ 0600 Fetal Wellbeing:  Category I Pain Control:  epidural PRN    [name], MD 10/27/2024, 6:06 AM

## 2024-10-27 NOTE — Plan of Care (Signed)

## 2024-10-28 LAB — CBC
HCT: 32.7 % — ABNORMAL LOW (ref 36.0–46.0)
Hemoglobin: 10.7 g/dL — ABNORMAL LOW (ref 12.0–15.0)
MCH: 27.9 pg (ref 26.0–34.0)
MCHC: 32.7 g/dL (ref 30.0–36.0)
MCV: 85.2 fL (ref 80.0–100.0)
Platelets: 146 K/uL — ABNORMAL LOW (ref 150–400)
RBC: 3.84 MIL/uL — ABNORMAL LOW (ref 3.87–5.11)
RDW: 13.3 % (ref 11.5–15.5)
WBC: 18.8 K/uL — ABNORMAL HIGH (ref 4.0–10.5)
nRBC: 0 % (ref 0.0–0.2)

## 2024-10-28 NOTE — Progress Notes (Addendum)
 Post Partum Day 1 Subjective: Pain controlled, lochia normal. Tolerating PO and voiding without difficulty. Working on lactation  Objective: Blood pressure 112/72, pulse 82, temperature 98.2 F (36.8 C), temperature source Oral, resp. rate 18, height 5' 3 (1.6 m), weight 97.1 kg, last menstrual period 01/14/2024, SpO2 99%, unknown if currently breastfeeding.  Physical Exam:  General: alert, cooperative, and no distress Lochia: appropriate Uterine Fundus: firm, tender DVT Evaluation: No evidence of DVT seen on physical exam. No significant calf/ankle edema.  Recent Labs    10/26/24 1800 10/28/24 0421  HGB 14.1 10.7*  HCT 41.5 32.7*    Assessment/Plan: 38 yo G1 now P1 PPD#1 s/p NSVD following IOL late term  - PP: continue care  - Placenta and baby warm at delivery without maternal fever and diagnosis of chorioamnionitis. S/p unasyn  x 1. Continue observation today  - Lactation support - Anxiety/depression: stable on zoloft  - Dispo: anticipate DC home tomorrow   LOS: 2 days   Christy DELENA Sharps, DO 10/28/2024, 9:58 AM

## 2024-10-28 NOTE — Plan of Care (Signed)
 Problem: Education: Goal: Knowledge of General Education information will improve Description: Including pain rating scale, medication(s)/side effects and non-pharmacologic comfort measures 10/28/2024 0706 by Madison Rosina LABOR, LPN Outcome: Progressing 10/27/2024 1752 by Madison Rosina LABOR, LPN Outcome: Progressing   Problem: Health Behavior/Discharge Planning: Goal: Ability to manage health-related needs will improve 10/28/2024 0706 by Madison Rosina LABOR, LPN Outcome: Progressing 10/27/2024 1752 by Madison Rosina LABOR, LPN Outcome: Progressing   Problem: Clinical Measurements: Goal: Ability to maintain clinical measurements within normal limits will improve 10/28/2024 0706 by Madison Rosina LABOR, LPN Outcome: Progressing 10/27/2024 1752 by Madison Rosina LABOR, LPN Outcome: Progressing Goal: Will remain free from infection 10/28/2024 0706 by Madison Rosina LABOR, LPN Outcome: Progressing 10/27/2024 1752 by Madison Rosina LABOR, LPN Outcome: Progressing Goal: Diagnostic test results will improve 10/28/2024 0706 by Madison Rosina LABOR, LPN Outcome: Progressing 10/27/2024 1752 by Madison Rosina LABOR, LPN Outcome: Progressing Goal: Respiratory complications will improve 10/28/2024 0706 by Madison Rosina LABOR, LPN Outcome: Progressing 10/27/2024 1752 by Madison Rosina LABOR, LPN Outcome: Progressing Goal: Cardiovascular complication will be avoided 10/28/2024 0706 by Madison Rosina LABOR, LPN Outcome: Progressing 10/27/2024 1752 by Madison Rosina LABOR, LPN Outcome: Progressing   Problem: Activity: Goal: Risk for activity intolerance will decrease 10/28/2024 0706 by Madison Rosina LABOR, LPN Outcome: Progressing 10/27/2024 1752 by Madison Rosina LABOR, LPN Outcome: Progressing   Problem: Nutrition: Goal: Adequate nutrition will be maintained 10/28/2024 0706 by Madison Rosina LABOR, LPN Outcome: Progressing 10/27/2024 1752 by Madison Rosina LABOR, LPN Outcome: Progressing   Problem: Coping: Goal: Level of anxiety will decrease 10/28/2024 0706 by Madison Rosina LABOR, LPN Outcome: Progressing 10/27/2024 1752 by Madison Rosina LABOR, LPN Outcome: Progressing   Problem: Elimination: Goal: Will not experience complications related to bowel motility 10/28/2024 0706 by Madison Rosina LABOR, LPN Outcome: Progressing 10/27/2024 1752 by Madison Rosina LABOR, LPN Outcome: Progressing Goal: Will not experience complications related to urinary retention 10/28/2024 0706 by Madison Rosina LABOR, LPN Outcome: Progressing 10/27/2024 1752 by Madison Rosina LABOR, LPN Outcome: Progressing   Problem: Pain Managment: Goal: General experience of comfort will improve and/or be controlled 10/28/2024 0706 by Madison Rosina LABOR, LPN Outcome: Progressing 10/27/2024 1752 by Madison Rosina LABOR, LPN Outcome: Progressing   Problem: Safety: Goal: Ability to remain free from injury will improve 10/28/2024 0706 by Madison Rosina LABOR, LPN Outcome: Progressing 10/27/2024 1752 by Madison Rosina LABOR, LPN Outcome: Progressing   Problem: Skin Integrity: Goal: Risk for impaired skin integrity will decrease 10/28/2024 0706 by Madison Rosina LABOR, LPN Outcome: Progressing 10/27/2024 1752 by Madison Rosina LABOR, LPN Outcome: Progressing   Problem: Education: Goal: Knowledge of Childbirth will improve 10/28/2024 0706 by Madison Rosina LABOR, LPN Outcome: Progressing 10/27/2024 1752 by Madison Rosina LABOR, LPN Outcome: Progressing Goal: Ability to make informed decisions regarding treatment and plan of care will improve 10/28/2024 0706 by Madison Rosina LABOR, LPN Outcome: Progressing 10/27/2024 1752 by Madison Rosina LABOR, LPN Outcome: Progressing Goal: Ability to state and carry out methods to decrease the pain will improve 10/28/2024 0706 by Madison Rosina LABOR, LPN Outcome: Progressing 10/27/2024 1752 by Madison Rosina LABOR, LPN Outcome: Progressing Goal: Individualized Educational Video(s) 10/28/2024 0706 by Madison Rosina LABOR, LPN Outcome: Progressing 10/27/2024 1752 by Madison Rosina LABOR, LPN Outcome: Progressing   Problem: Coping: Goal:  Ability to verbalize concerns and feelings about labor and delivery will improve 10/28/2024 0706 by Madison Rosina LABOR, LPN Outcome: Progressing 10/27/2024 1752 by Madison Rosina LABOR, LPN Outcome: Progressing   Problem: Life Cycle: Goal: Ability to make normal progression  through stages of labor will improve 10/28/2024 0706 by Madison Rosina LABOR, LPN Outcome: Progressing 10/27/2024 1752 by Madison Rosina LABOR, LPN Outcome: Progressing Goal: Ability to effectively push during vaginal delivery will improve 10/28/2024 0706 by Madison Rosina LABOR, LPN Outcome: Progressing 10/27/2024 1752 by Madison Rosina LABOR, LPN Outcome: Progressing   Problem: Role Relationship: Goal: Will demonstrate positive interactions with the child 10/28/2024 0706 by Madison Rosina LABOR, LPN Outcome: Progressing 10/27/2024 1752 by Madison Rosina LABOR, LPN Outcome: Progressing   Problem: Safety: Goal: Risk of complications during labor and delivery will decrease 10/28/2024 0706 by Madison Rosina LABOR, LPN Outcome: Progressing 10/27/2024 1752 by Madison Rosina LABOR, LPN Outcome: Progressing   Problem: Pain Management: Goal: Relief or control of pain from uterine contractions will improve 10/28/2024 0706 by Madison Rosina LABOR, LPN Outcome: Progressing 10/27/2024 1752 by Madison Rosina LABOR, LPN Outcome: Progressing   Problem: Education: Goal: Knowledge of condition will improve Outcome: Progressing Goal: Individualized Educational Video(s) Outcome: Progressing Goal: Individualized Newborn Educational Video(s) Outcome: Progressing   Problem: Activity: Goal: Will verbalize the importance of balancing activity with adequate rest periods Outcome: Progressing Goal: Ability to tolerate increased activity will improve Outcome: Progressing   Problem: Coping: Goal: Ability to identify and utilize available resources and services will improve Outcome: Progressing   Problem: Life Cycle: Goal: Chance of risk for complications during the postpartum period  will decrease Outcome: Progressing   Problem: Role Relationship: Goal: Ability to demonstrate positive interaction with newborn will improve Outcome: Progressing   Problem: Skin Integrity: Goal: Demonstration of wound healing without infection will improve Outcome: Progressing

## 2024-10-28 NOTE — Social Work (Signed)
 MOB was referred for history of depression/anxiety.  * Referral screened out by Clinical Social Worker because none of the following criteria appear to apply:  ~ History of anxiety/depression during this pregnancy, or of post-partum depression following prior delivery.  ~ Diagnosis of anxiety and/or depression within last 3 years OR * MOB's symptoms currently being treated with medication and/or therapy. Per chart review MOB has an active prescription for Zoloft  50mg , [place]=0  Please contact the Clinical Social Worker if needs arise, or by MOB request.   Christy Alvarado, LCSWA Clinical Social Worker [phone]

## 2024-10-28 NOTE — Lactation Note (Signed)
 This note was copied from a baby's chart. Lactation Consultation Note  Patient Name: Christy Alvarado Unijb'd Date: 10/28/2024 Age:36 hours Reason for consult: Mother's request;Difficult latch;Primapara;Nipple pain/trauma  P1. Assisted in latching coaching mom hand placement, body alignment, support, props and being in control over the baby. Mom 's nipples are red and tender. Painful latched. No pinching just sore during feeding. Encouraged mom to wear coconut oil. LC applied. Baby fed well then came off rooting wanting more. Suggested dad hold baby, but that wasn't getting it, baby wanted to nurse. Placed baby in football on Lt. Breast. Baby opened wide for good latch.  Maternal Data Has patient been taught Hand Expression?: Yes Does the patient have breastfeeding experience prior to this delivery?: No  Feeding    LATCH Score Latch: Grasps breast easily, tongue down, lips flanged, rhythmical sucking.  Audible Swallowing: A few with stimulation  Type of Nipple: Everted at rest and after stimulation  Comfort (Breast/Nipple): Filling, red/small blisters or bruises, mild/mod discomfort  Hold (Positioning): Assistance needed to correctly position infant at breast and maintain latch.  LATCH Score: 7   Lactation Tools Discussed/Used Tools: Shells;Pump;Coconut oil Flange Size: 18 Breast pump type: Manual  Interventions Interventions: Breast feeding basics reviewed;Assisted with latch;Skin to skin;Breast massage;Hand express;Breast compression;Pre-pump if needed;Reverse pressure;Adjust position;Support pillows;Position options;Shells;Hand pump;Coconut oil;Education  Discharge Discharge Education: Outpatient recommendation Pump: DEBP (Spectra )  Consult Status Consult Status: Follow-up Date: 10/28/24 Follow-up type: In-patient    Elleni Mozingo G 10/28/2024, 3:52 AM

## 2024-10-28 NOTE — Lactation Note (Signed)
 This note was copied from a baby's chart. Lactation Consultation Note  Patient Name: Christy Alvarado Date: 10/28/2024 Age:36 hours Reason for consult: 1st time breastfeeding;Follow-up assessment;Term (See MOB: MR- AMA, infant weight loss -1.01%.). P1, term female infant  Christy Alvarado, Prior to latching infant at the breast MOB did reverse pressure softening which help evert nipple shaft out more. MOB latched infant on her right breast using the cross cradle hold with pillow support, infant sustained latch and still breastfeeding after 11 minutes when LC left the room. MOB will continue to BF infant by cues, on demand, 8-12 times within 24 hours, skin to skin. MOB knows to call for further latch assistance if needed. LC discussed that Christy Alvarado may start cluster feeding on Day 2 after 24 hours and this is normal pattern of behavior. LC reinforced the importance of maternal rest, meals and hydration. MOB changed void diaper while LC was in the room.   Maternal Data    Feeding Mother's Current Feeding Choice: Breast Milk  LATCH Score Latch: Grasps breast easily, tongue down, lips flanged, rhythmical sucking.  Audible Swallowing: A few with stimulation  Type of Nipple: Everted at rest and after stimulation  Comfort (Breast/Nipple): Filling, red/small blisters or bruises, mild/mod discomfort (MOB has nipple stripes on both breast due previous shallow latches.)  Hold (Positioning): Assistance needed to correctly position infant at breast and maintain latch.  LATCH Score: 7   Lactation Tools Discussed/Used Tools: Shells  Interventions Interventions: Skin to skin;Assisted with latch;Breast compression;Adjust position;Support pillows;Position options;Education  Discharge    Consult Status Consult Status: Follow-up Date: 10/29/24    Grayce LULLA Batter 10/28/2024, 12:39 PM

## 2024-10-28 NOTE — Anesthesia Postprocedure Evaluation (Signed)
 Anesthesia Post Note  Patient: Toniqua R Toto  Procedure(s) Performed: AN AD HOC LABOR EPIDURAL     Patient location during evaluation: Mother Baby Anesthesia Type: Epidural Level of consciousness: awake, awake and alert and oriented Pain management: satisfactory to patient Vital Signs Assessment: post-procedure vital signs reviewed and stable Respiratory status: nonlabored ventilation, spontaneous breathing and respiratory function stable Cardiovascular status: blood pressure returned to baseline and stable Postop Assessment: no headache, no backache, no apparent nausea or vomiting, patient able to bend at knees, adequate PO intake and able to ambulate Anesthetic complications: no   No notable events documented.  Last Vitals:  Vitals:   10/28/24 0010 10/28/24 0400  BP: 110/76 112/72  Pulse: 86 82  Resp: 18 18  Temp: 36.7 C 36.8 C  SpO2: 98% 99%    Last Pain:  Vitals:   10/28/24 0600  TempSrc:   PainSc: Asleep   Pain Goal:                   Delitha Elms

## 2024-10-28 NOTE — Lactation Note (Signed)
 This note was copied from a baby's chart. Lactation Consultation Note  Patient Name: Christy Alvarado Date: 10/28/2024 Age:36 hours Reason for consult: Follow-up assessment;Primapara;Term;Difficult latch;Mother's request  P1. Mom called for latch assist. Both nipples are sore and red. Coconut oil given and applied. Worked w/mom on positioning and latching. Baby BF well.  Baby is swallowing but not audible. Maternal Data Has patient been taught Hand Expression?: Yes Does the patient have breastfeeding experience prior to this delivery?: No  Feeding    LATCH Score Latch: Grasps breast easily, tongue down, lips flanged, rhythmical sucking.  Audible Swallowing: A few with stimulation  Type of Nipple: Everted at rest and after stimulation (short shaft)  Comfort (Breast/Nipple): Filling, red/small blisters or bruises, mild/mod discomfort (red and tender)  Hold (Positioning): Assistance needed to correctly position infant at breast and maintain latch.  LATCH Score: 7   Lactation Tools Discussed/Used Tools: Shells;Pump;Flanges;Coconut oil Flange Size: 18 Breast pump type: Manual  Interventions Interventions: Breast feeding basics reviewed;Assisted with latch;Skin to skin;Breast massage;Hand express;Breast compression;Adjust position;Support pillows;Position options;Coconut oil  Discharge Discharge Education: Outpatient recommendation Pump: DEBP (Spectra )  Consult Status Consult Status: Follow-up Date: 10/28/24 Follow-up type: In-patient    Abelino Tippin G 10/28/2024, 3:29 AM

## 2024-10-29 MED ORDER — SERTRALINE HCL 50 MG PO TABS: 50.0000 mg | ORAL_TABLET | Freq: Every day | ORAL | Status: AC

## 2024-10-29 MED ORDER — ACETAMINOPHEN 325 MG PO TABS: 650.0000 mg | ORAL_TABLET | ORAL | Status: AC | PRN

## 2024-10-29 MED ORDER — IBUPROFEN 600 MG PO TABS: 600.0000 mg | ORAL_TABLET | Freq: Four times a day (QID) | ORAL | Status: AC

## 2024-10-29 NOTE — Lactation Note (Addendum)
 This note was copied from a baby's chart. Lactation Consultation Note  Patient Name: Christy Alvarado Date: 10/29/2024 Age:36 hours  Reason for consult: Follow-up assessment;1st time breastfeeding (change in weight -1.01 to -6.73%).  P1, Infant had 4 stools and 5 voids since 0100 am today. MOB working on infant having a deeper latch, when infant came off the breast with current feeding LC observed MOB nipple was rounded and not pinched. Per MOB, infant clustered feed throughout the night, LC explained that is normal. MOB gave pacifier throughout the night. LC suggested MOB delay pacifier until 3 weeks postpartum to work on infant having deeper latch. LC discussed MOB can hand express after latching infant at the breast to give extra volume of colostrum by spoon. MOB did mention infant's skin has yellowish tint will monitor infant's billi level and LC did mention give her EBM with hand expression and donor milk is available as well. MOB is improving with latching infant to breast herself without assistance. LC discussed if infant's latch is uncomfortable or pinching break infant's latch and re-latch infant at the breast. [facility] sent referral for [facility] services for continue breastfeeding support after hospital discharge within the community. MOB knows to continue to call [facility] services today if further latch asssitance is needed. MOB will continue to breastfeed infant by cues, on demand, 8-12 times within 24 hours, skin to skin.  Current feeding plan day 2 of life. 1- MOB will continue to breastfeed infant by cues on demand, 8-12 times within 24 hours, skin to skin. 2- MOB knows she can offer her own EBM after feedings to give infant extra volume of colostrum and also helps to increase her milk supply. 3- MOB  will break infant's latch immediately if she feels pain or discomfort. 4- MOB will continue to apply colostrum or coconut oil to breast for breast soreness.    Early discharge education  if MOB leaves today: 1- LC discussed engorgement treatment and prevention. 2- LC discussed warning signs of dehydration in infant. 3- LC discussed how to know if breastfeeding is going well. 4- MOB will follow up with outpatient [facility] clinic for continue breastfeeding support and assistance after hospital discharge. Referral sent today. Maternal Data    Feeding Mother's Current Feeding Choice: Breast Milk  LATCH Score Latch: Grasps breast easily, tongue down, lips flanged, rhythmical sucking.  Audible Swallowing: A few with stimulation  Type of Nipple: Everted at rest and after stimulation  Comfort (Breast/Nipple): Filling, red/small blisters or bruises, mild/mod discomfort (LC oberved MOB nipples are healing , previously shallow latches.)  Hold (Positioning): Assistance needed to correctly position infant at breast and maintain latch.  LATCH Score: 7   Lactation Tools Discussed/Used    Interventions Interventions: Skin to skin;Assisted with latch;Breast compression;Adjust position;Support pillows;Hand express  Discharge    Consult Status Consult Status: Follow-up Date: 10/30/24 Follow-up type: In-patient [facility] sent referral for Outpatient LC once discharge for continued LC assistance and support.)    Grayce LULLA Batter 10/29/2024, 9:48 AM

## 2024-10-29 NOTE — Progress Notes (Signed)
 Post Partum Day 2 Subjective: Pain controlled, lochia normal. Ambulating, tolerating PO, and voiding without difficulty. Passing gas. Working on lactation  Objective:    10/29/2024    5:00 AM 10/28/2024    8:11 PM 10/28/2024    4:00 AM  Vitals with BMI  Systolic 105 131 887  Diastolic 74 66 72  Pulse 60 81 82     Physical Exam:  General: alert, cooperative, and no distress Lochia: appropriate Uterine Fundus: firm, non-tender DVT Evaluation: No evidence of DVT seen on physical exam. No significant calf/ankle edema.  Recent Labs    10/26/24 1800 10/28/24 0421  HGB 14.1 10.7*  HCT 41.5 32.7*    Assessment/Plan: 38 yo G1 now P1001 PPD#2 s/p NSVD following IOL late term  - PP: continue care  - Placenta and baby warm at delivery without maternal fever and diagnosis of chorioamnionitis. S/p unasyn  x 1. WBC trend 13.4->18.8, however patient without s/sx of infection.  - Lactation support - Anxiety/depression: stable on zoloft  - Dispo: anticipate DC home today   LOS: 3 days   Rubie DELENA Husky, MD 10/29/2024, 8:10 AM

## 2024-10-29 NOTE — Discharge Summary (Signed)
 Postpartum Discharge Summary  Date of Service updated 11/24-11/27/2025     Patient Name: Christy Alvarado DOB: June 26, 1986 MRN: [id number]  Date of admission: 10/26/2024 Delivery date:10/27/2024 Delivering provider: SHIVAJI, SHANTI M Date of discharge: 10/29/2024  Admitting diagnosis: Pregnant [Z34.90] Intrauterine pregnancy: [redacted]w[redacted]d     Secondary diagnosis:  Principal Problem:   Pregnant  Additional problems: AMA, asthma, rubella non-immune, anxiety/depression    Discharge diagnosis: Term Pregnancy Delivered                                              Post partum procedures:received one dose of unasyn  Augmentation: AROM, Pitocin , Cytotec , and IP Foley Complications: None  Hospital course: Induction of Labor With Vaginal Delivery   38 y.o. yo G1P1001 at [redacted]w[redacted]d was admitted to the hospital 10/26/2024 for induction of labor.  Indication for induction: Postdates.  Patient had an labor course complicated by nothing Membrane Rupture Time/Date: 5:58 AM,10/27/2024  Delivery Method:Vaginal, Spontaneous Episiotomy: None Lacerations:  2nd degree Details of delivery can be found in separate delivery note.  Patient had a postpartum course complicated by nothing. Patient is discharged home 10/29/24.  Newborn Data: Birth date:10/27/2024 Birth time:3:16 PM Gender:Female Living status:Living Apgars:4 ,7  Weight:2970 g  Magnesium Sulfate received: No BMZ received: No Rhophylac:No MMR:No T-DaP:Given prenatally Flu: Yes RSV Vaccine received: Yes Transfusion:No Immunizations administered: Immunization History  Administered Date(s) Administered   Influenza Inj Mdck Quad Pf 10/01/2017   Influenza Split 09/01/2012, 08/12/2024   Influenza, Mdck, Trivalent,PF 6+ MOS(egg free) 11/14/2023   Influenza, Quadrivalent, Recombinant, Inj, Pf 10/08/2019   Influenza,inj,Quad PF,6+ Mos 10/01/2017   Influenza-Unspecified 10/01/2017, 08/29/2021   PFIZER(Purple Top)SARS-COV-2 Vaccination  02/15/2020, 03/07/2020, 09/26/2020   Tdap 07/13/2019, 07/30/2024    Physical exam  Vitals:   10/28/24 0010 10/28/24 0400 10/28/24 2011 10/29/24 0500  BP: 110/76 112/72 131/66 105/74  Pulse: 86 82 81 60  Resp: 18 18 19 18   Temp: 98.1 F (36.7 C) 98.2 F (36.8 C) 98.2 F (36.8 C) 98.2 F (36.8 C)  TempSrc: Oral Oral Oral Oral  SpO2: 98% 99% 99% 99%  Weight:      Height:       General: alert, cooperative, and no distress Lochia: appropriate Uterine Fundus: firm, non-tender DVT Evaluation: No evidence of DVT seen on physical exam. Labs: Lab Results  Component Value Date   WBC 18.8 (H) 10/28/2024   HGB 10.7 (L) 10/28/2024   HCT 32.7 (L) 10/28/2024   MCV 85.2 10/28/2024   PLT 146 (L) 10/28/2024      Latest Ref Rng & Units 10/26/2024    6:00 PM  CMP  Glucose 70 - 99 mg/dL 95   BUN 6 - 20 mg/dL 10   Creatinine 9.55 - 1.00 mg/dL 9.32   Sodium 864 - 854 mmol/L 135   Potassium 3.5 - 5.1 mmol/L 3.5   Chloride 98 - 111 mmol/L 105   CO2 22 - 32 mmol/L 18   Calcium 8.9 - 10.3 mg/dL 9.3   Total Protein 6.5 - 8.1 g/dL 6.7   Total Bilirubin 0.0 - 1.2 mg/dL 0.4   Alkaline Phos 38 - 126 U/L 233   AST 15 - 41 U/L 24   ALT 0 - 44 U/L 25    Edinburgh Score:    10/28/2024    5:23 PM  Edinburgh Postnatal Depression Scale Screening Tool  I have been able to laugh and see the funny side of things. 0  I have looked forward with enjoyment to things. 0  I have blamed myself unnecessarily when things went wrong. 0  I have been anxious or worried for no good reason. 0  I have felt scared or panicky for no good reason. 0  Things have been getting on top of me. 0  I have been so unhappy that I have had difficulty sleeping. 0  I have felt sad or miserable. 0  I have been so unhappy that I have been crying. 0  The thought of harming myself has occurred to me. 0  Edinburgh Postnatal Depression Scale Total 0      After visit meds:  Allergies as of 10/29/2024   No Known Allergies       Medication List     STOP taking these medications    meloxicam  15 MG tablet Commonly known as: MOBIC    terconazole  0.4 % vaginal cream Commonly known as: TERAZOL 7        TAKE these medications    acetaminophen  325 MG tablet Commonly known as: Tylenol  Take 2 tablets (650 mg total) by mouth every 4 (four) hours as needed (for pain scale < 4).   Airsupra  90-80 MCG/ACT Aero Generic drug: Albuterol -Budesonide  Inhale 2 puffs into the lungs every 4 (four) hours as needed.   fluticasone -salmeterol 100-50 MCG/ACT Aepb Commonly known as: Wixela Inhub Inhale 1 puff into the lungs daily.   ibuprofen  600 MG tablet Commonly known as: ADVIL  Take 1 tablet (600 mg total) by mouth every 6 (six) hours.   levocetirizine 5 MG tablet Commonly known as: XYZAL  Take 1 tablet (5 mg total) by mouth in the morning.   montelukast  10 MG tablet Commonly known as: SINGULAIR  Take 1 tablet (10 mg total) by mouth at bedtime.   PRENATAL PO Take by mouth.   sertraline  50 MG tablet Commonly known as: ZOLOFT  Take 1 tablet (50 mg total) by mouth at bedtime. What changed: See the new instructions.         Discharge home in stable condition Infant Feeding: Breast Infant Disposition:home with mother Discharge instruction: per After Visit Summary and Postpartum booklet. Activity: Advance as tolerated. Pelvic rest for 6 weeks.  Diet: routine diet Anticipated Birth Control: Unsure Postpartum Appointment:4 weeks Additional Postpartum F/U: Postpartum Depression checkup and offer MMR vaccine for rubella non-immune Future Appointments:No future appointments. Follow up Visit:  Follow-up Information     Ob/Gyn, Landy Stains. Schedule an appointment as soon as possible for a visit in 4 week(s).   Contact information: [address] [place] [address] [phone]                     10/29/2024 Christy DELENA Husky, MD

## 2024-11-03 LAB — SURGICAL PATHOLOGY

## 2024-11-07 ENCOUNTER — Telehealth (HOSPITAL_COMMUNITY): Payer: Self-pay | Admitting: *Deleted

## 2024-11-07 NOTE — Telephone Encounter (Signed)
 Attempted hospital discharge follow-up call. Left message for patient to return RN call with any questions or concerns. Allean IVAR Carton, RN, 11/07/24, [phone]
